# Patient Record
Sex: Male | Born: 1960 | Race: Black or African American | Hispanic: No | Marital: Married | Smoking: Never smoker
Health system: Southern US, Community
[De-identification: ages and names within clinical notes are randomized; demographics above are authoritative.]

## PROBLEM LIST (undated history)

## (undated) DIAGNOSIS — K219 Gastro-esophageal reflux disease without esophagitis: Secondary | ICD-10-CM

## (undated) DIAGNOSIS — N183 Chronic kidney disease, stage 3 unspecified: Secondary | ICD-10-CM

## (undated) DIAGNOSIS — T7840XA Allergy, unspecified, initial encounter: Secondary | ICD-10-CM

## (undated) DIAGNOSIS — G4730 Sleep apnea, unspecified: Secondary | ICD-10-CM

## (undated) DIAGNOSIS — I10 Essential (primary) hypertension: Secondary | ICD-10-CM

## (undated) DIAGNOSIS — E7800 Pure hypercholesterolemia, unspecified: Secondary | ICD-10-CM

## (undated) HISTORY — PX: KNEE CARTILAGE SURGERY: SHX688

## (undated) HISTORY — DX: Allergy, unspecified, initial encounter: T78.40XA

## (undated) HISTORY — DX: Sleep apnea, unspecified: G47.30

---

## 2000-06-07 ENCOUNTER — Emergency Department (HOSPITAL_COMMUNITY): Admission: EM | Admit: 2000-06-07 | Discharge: 2000-06-07 | Payer: Self-pay | Admitting: Emergency Medicine

## 2001-08-30 ENCOUNTER — Ambulatory Visit (HOSPITAL_BASED_OUTPATIENT_CLINIC_OR_DEPARTMENT_OTHER): Admission: RE | Admit: 2001-08-30 | Discharge: 2001-08-30 | Payer: Self-pay | Admitting: Otolaryngology

## 2001-10-28 ENCOUNTER — Emergency Department (HOSPITAL_COMMUNITY): Admission: EM | Admit: 2001-10-28 | Discharge: 2001-10-28 | Payer: Self-pay | Admitting: *Deleted

## 2003-03-18 ENCOUNTER — Emergency Department (HOSPITAL_COMMUNITY): Admission: EM | Admit: 2003-03-18 | Discharge: 2003-03-18 | Payer: Self-pay | Admitting: Emergency Medicine

## 2008-02-21 ENCOUNTER — Emergency Department (HOSPITAL_COMMUNITY): Admission: EM | Admit: 2008-02-21 | Discharge: 2008-02-21 | Payer: Self-pay | Admitting: Emergency Medicine

## 2011-06-04 DIAGNOSIS — I10 Essential (primary) hypertension: Secondary | ICD-10-CM | POA: Insufficient documentation

## 2016-01-07 ENCOUNTER — Ambulatory Visit: Payer: BC Managed Care – PPO | Attending: Family Medicine | Admitting: Physical Therapy

## 2016-10-12 ENCOUNTER — Emergency Department (HOSPITAL_BASED_OUTPATIENT_CLINIC_OR_DEPARTMENT_OTHER): Payer: BC Managed Care – PPO

## 2016-10-12 ENCOUNTER — Emergency Department (HOSPITAL_BASED_OUTPATIENT_CLINIC_OR_DEPARTMENT_OTHER)
Admission: EM | Admit: 2016-10-12 | Discharge: 2016-10-12 | Disposition: A | Discharge status: Home / Self Care | Payer: BC Managed Care – PPO | Attending: Emergency Medicine | Admitting: Emergency Medicine

## 2016-10-12 ENCOUNTER — Encounter (HOSPITAL_BASED_OUTPATIENT_CLINIC_OR_DEPARTMENT_OTHER): Payer: Self-pay | Admitting: Emergency Medicine

## 2016-10-12 DIAGNOSIS — M25562 Pain in left knee: Secondary | ICD-10-CM | POA: Diagnosis not present

## 2016-10-12 DIAGNOSIS — I10 Essential (primary) hypertension: Secondary | ICD-10-CM | POA: Diagnosis not present

## 2016-10-12 HISTORY — DX: Essential (primary) hypertension: I10

## 2016-10-12 MED ORDER — NAPROXEN 375 MG PO TABS: 375.0000 mg | ORAL_TABLET | Freq: Two times a day (BID) | ORAL | 0 refills | Status: DC

## 2016-10-12 NOTE — ED Triage Notes (Signed)
Patient reports inflammation and pain to left knee x 3 weeks.  Denies injury.

## 2016-10-12 NOTE — ED Provider Notes (Signed)
Ryan Duarte   CSN: [id number] Arrival date & time: 10/12/16  1219     History   Chief Complaint Chief Complaint  Patient presents with  . Knee Pain    HPI Ryan Duarte is a 56 y.o. male with history of hypertension who presents with a 1 month history of left knee pain and swelling. Patient states his pain started after playing a basketball game. Patient doesn't remember a specific trauma, however he states he may have overextended his knee or something of the like. Patient does not have any pain at rest or with passive movement. He has pain after walking, squatting, or twisting his knee and certain directions. Patient has taken Tylenol at home without significant relief. He is also used ice. 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 He has not been able to rest his knee very much. He denies any fevers. He denies any numbness, however has had some intermittent tingling to the area.  HPI  Past Medical History:  Diagnosis Date  . Hypertension     There are no active problems to display for this patient.   History reviewed. No pertinent surgical history.     Home Medications    Prior to Admission medications   Medication Sig Start Date End Date Taking? Authorizing Provider  naproxen (NAPROSYN) 375 MG tablet Take 1 tablet (375 mg total) by mouth 2 (two) times daily. 10/12/16   Frederica Kuster, PA-C    Family History History reviewed. No pertinent family history.  Social History Social History  Substance Use Topics  . Smoking status: Never Smoker  . Smokeless tobacco: Never Used  . Alcohol use No     Allergies   Patient has no known allergies.   Review of Systems Review of Systems  Constitutional: Negative for fever.  Musculoskeletal: Positive for arthralgias and joint swelling.  Neurological: Negative for numbness.     Physical Exam Updated Vital Signs BP (!) 165/98 (BP  Location: Right Arm)   Pulse 96   Temp 98.4 F (36.9 C) (Oral)   Resp 16   Ht 5\' 11"  (1.803 m)   Wt 93 kg   SpO2 100%   BMI 28.59 kg/m   Physical Exam  Constitutional: He appears well-developed and well-nourished. No distress.  HENT:  Head: Normocephalic and atraumatic.  Mouth/Throat: Oropharynx is clear and moist. No oropharyngeal exudate.  Eyes: Conjunctivae are normal. Pupils are equal, round, and reactive to light. Right eye exhibits no discharge. Left eye exhibits no discharge. No scleral icterus.  Neck: Normal range of motion. Neck supple. No thyromegaly present.  Cardiovascular: Normal rate, regular rhythm, normal heart sounds and intact distal pulses.  Exam reveals no gallop and no friction rub.   No murmur heard. Pulmonary/Chest: Effort normal and breath sounds normal. No stridor. No respiratory distress. He has no wheezes. He has no rales.  Abdominal: Soft. Bowel sounds are normal. He exhibits no distension. There is no tenderness. There is no rebound and no guarding.  Musculoskeletal: He exhibits no edema.       Left knee: He exhibits swelling, effusion and bony tenderness. He exhibits normal range of motion, no LCL laxity and no MCL laxity. Tenderness found. Medial joint line, lateral joint line and patellar tendon tenderness noted.  L knee: Joint line tenderness, some tenderness to patellar tendon; negative anterior/posterior drawer, negative McMurray's, no pain with passive range of motion, no warmth  or erythema to the joint  Lymphadenopathy:    He has no cervical adenopathy.  Neurological: He is alert. Coordination normal.  Skin: Skin is warm and dry. No rash noted. He is not diaphoretic. No pallor.  Psychiatric: He has a normal mood and affect.  Nursing Duarte and vitals reviewed.    ED Treatments / Results  Labs (all labs ordered are listed, but only abnormal results are displayed) Labs Reviewed - No data to display  EKG  EKG Interpretation None        Radiology Dg Knee Complete 4 Views Left  Result Date: 10/12/2016 CLINICAL DATA:  Three-week history of pain EXAM: LEFT KNEE - COMPLETE 4+ VIEW COMPARISON:  None. FINDINGS: Frontal, lateral, and bilateral oblique views were obtained. No fracture or dislocation. There is a joint effusion, moderate. Joint spaces appear normal. No erosive change. IMPRESSION: Moderate joint effusion. No fracture or dislocation. No appreciable arthropathy. Electronically Signed   By: Lowella Grip III M.D.   On: 10/12/2016 13:34    Procedures Procedures (including critical care time)  Medications Ordered in ED Medications - No data to display   Initial Impression / Assessment and Plan / ED Course  I have reviewed the triage vital signs and the nursing notes.  Pertinent labs & imaging results that were available during my care of the patient were reviewed by me and considered in my medical decision making (see chart for details).     Patient with 1 month of knee pain after basketball game. X-ray shows moderate joint effusion, no fracture or dislocation or appreciable arthropathy. Suspect soft tissue injury. Doubt septic joint, as patient has no pain with passive range of motion or warmth or erythema of the joint. We'll place an knee sleeve and given crutches for comfort. Supportive treatment discussed including ice, elevation. Will begin NSAID treatment with Naprosyn. Follow-up to Dr. Barbaraann Barthel for further evaluation and treatment. Return precautions discussed. Patient understands and agrees with plan. Patient vitals stable to ED course and discharged in satisfactory condition.  Final Clinical Impressions(s) / ED Diagnoses   Final diagnoses:  Acute pain of left knee    New Prescriptions New Prescriptions   NAPROXEN (NAPROSYN) 375 MG TABLET    Take 1 tablet (375 mg total) by mouth 2 (two) times daily.     Frederica Kuster, PA-C 10/12/16 [place], DO 10/12/16 1443

## 2016-10-12 NOTE — Discharge Instructions (Signed)
Medications: Naprosyn  Treatment: Take Naprosyn twice daily for 10 days. You can alternate with Tylenol as prescribed over-the-counter. Do not take ibuprofen or other NSAID medication with Naprosyn. Use ice 3-4 times daily alternating 20 minutes on, 20 minutes off. Keep leg elevated whenever you're not walking on it. Ambulate with crutches and use knee brace for comfort, support, and to help the swelling.  Follow-up: Please follow-up with Dr. Barbaraann Barthel, a sports medicine doctor, for further evaluation and treatment of your knee pain. Please return to emergency department immediately if you develop any fevers, pain with small movements of your knee, redness, or warmth of the joint.

## 2016-10-20 ENCOUNTER — Encounter: Payer: Self-pay | Admitting: Family Medicine

## 2016-10-20 ENCOUNTER — Ambulatory Visit (INDEPENDENT_AMBULATORY_CARE_PROVIDER_SITE_OTHER): Payer: BC Managed Care – PPO | Admitting: Family Medicine

## 2016-10-20 DIAGNOSIS — M25562 Pain in left knee: Secondary | ICD-10-CM

## 2016-10-20 NOTE — Patient Instructions (Addendum)
Your knee pain is either due to arthritis (more than is seen on x-rays) or a medial meniscus tear. Both are treated similarly initially. These are the different medications you can take for this: Tylenol 500mg  1-2 tabs three times a day for pain. Naproxen twice a day with food - when you run out of this you can buy aleve and take 2 tablets twice a day with food. Capsaicin, aspercreme, or biofreeze topically up to four times a day may also help with pain. Cortisone injections are an option. It's important that you continue to stay active. Straight leg raises, knee extensions 3 sets of 10 once a day (add ankle weight if these become too easy). Consider physical therapy to strengthen muscles around the joint that hurts to take pressure off of the joint itself. Shoe inserts with good arch support may be helpful. Heat or ice 15 minutes at a time 3-4 times a day as needed to help with pain. Knee brace when up and walking around for support. Follow up with me in 1 month for reevaluation but call me sooner if you're struggling.

## 2016-10-22 DIAGNOSIS — M25562 Pain in left knee: Secondary | ICD-10-CM | POA: Insufficient documentation

## 2016-10-22 DIAGNOSIS — T7840XA Allergy, unspecified, initial encounter: Secondary | ICD-10-CM | POA: Insufficient documentation

## 2016-10-22 NOTE — Assessment & Plan Note (Signed)
independently reviewed radiographs and these were normal - no evidence of DJD but was not standing.  Based on history, exam, pain is consistent with either flare of arthritis (worse than is seen on radiographs) or degenerative medial meniscus tear.  Both treated similarly.  Start with tylenol, naproxen, topical medications, home exercises which were reviewed.  Knee brace for support.  Declined injection, formal PT for now.  F/u in 1 month.

## 2016-10-22 NOTE — Progress Notes (Signed)
PCP: [facility] @ [facility]  Subjective:   HPI: Patient is a 56 y.o. male here for left knee pain.  Patient reports he was playing basketball about 6 weeks ago. He believes it's possible he twisted his knee or landed wrong but can't recall obvious injury. After this he developed medial pain left knee that was sharp. Up to 6/10 level, worse with walking and prolonged standing. Some swelling. Taking naproxen from ED and relatively resting. No skin changes, numbness.  Past Medical History:  Diagnosis Date  . Hypertension     Current Outpatient Prescriptions on File Prior to Visit  Medication Sig Dispense Refill  . naproxen (NAPROSYN) 375 MG tablet Take 1 tablet (375 mg total) by mouth 2 (two) times daily. 20 tablet 0   No current facility-administered medications on file prior to visit.     No past surgical history on file.  No Known Allergies  Social History   Social History  . Marital status: Married    Spouse name: N/A  . Number of children: N/A  . Years of education: N/A   Occupational History  . Not on file.   Social History Main Topics  . Smoking status: Never Smoker  . Smokeless tobacco: Never Used  . Alcohol use No  . Drug use: No  . Sexual activity: Not on file   Other Topics Concern  . Not on file   Social History Narrative  . No narrative on file    No family history on file.  BP (!) 171/105   Pulse 82   Ht 5\' 10"  (1.778 m)   Wt 205 lb (93 kg)   BMI 29.41 kg/m   Review of Systems: See HPI above.     Objective:  Physical Exam:  Gen: NAD, comfortable in exam room  Left knee: No gross deformity, ecchymoses.  Minimal effusion. TTP medial joint line.  No other tenderness. FROM. Negative ant/post drawers. Negative valgus/varus testing. Negative lachmanns. Mild pain with mcmurrays, apleys, negative patellar apprehension. NV intact distally.  Right knee: FROM without pain.   Assessment & Plan:  1. Left knee pain -  independently reviewed radiographs and these were normal - no evidence of DJD but was not standing.  Based on history, exam, pain is consistent with either flare of arthritis (worse than is seen on radiographs) or degenerative medial meniscus tear.  Both treated similarly.  Start with tylenol, naproxen, topical medications, home exercises which were reviewed.  Knee brace for support.  Declined injection, formal PT for now.  F/u in 1 month.

## 2016-11-20 ENCOUNTER — Ambulatory Visit: Payer: BC Managed Care – PPO | Admitting: Family Medicine

## 2017-04-20 ENCOUNTER — Other Ambulatory Visit (HOSPITAL_COMMUNITY): Payer: Self-pay | Admitting: Family Medicine

## 2017-04-20 DIAGNOSIS — M5431 Sciatica, right side: Secondary | ICD-10-CM

## 2017-04-25 ENCOUNTER — Ambulatory Visit (HOSPITAL_COMMUNITY)
Admission: RE | Admit: 2017-04-25 | Discharge: 2017-04-25 | Disposition: A | Discharge status: Home / Self Care | Payer: BC Managed Care – PPO | Source: Ambulatory Visit | Attending: Family Medicine | Admitting: Family Medicine

## 2017-04-25 DIAGNOSIS — G959 Disease of spinal cord, unspecified: Secondary | ICD-10-CM | POA: Insufficient documentation

## 2017-04-25 DIAGNOSIS — M5126 Other intervertebral disc displacement, lumbar region: Secondary | ICD-10-CM | POA: Insufficient documentation

## 2017-04-25 DIAGNOSIS — M48061 Spinal stenosis, lumbar region without neurogenic claudication: Secondary | ICD-10-CM | POA: Insufficient documentation

## 2017-04-25 DIAGNOSIS — M5431 Sciatica, right side: Secondary | ICD-10-CM | POA: Insufficient documentation

## 2017-07-15 IMAGING — DX DG KNEE COMPLETE 4+V*L*
4 series · 4 of 4 positions shown · non-contrast
Comparison: None.

CLINICAL DATA: Three-week history of pain

EXAM:
LEFT KNEE - COMPLETE 4+ VIEW

[knee ap]
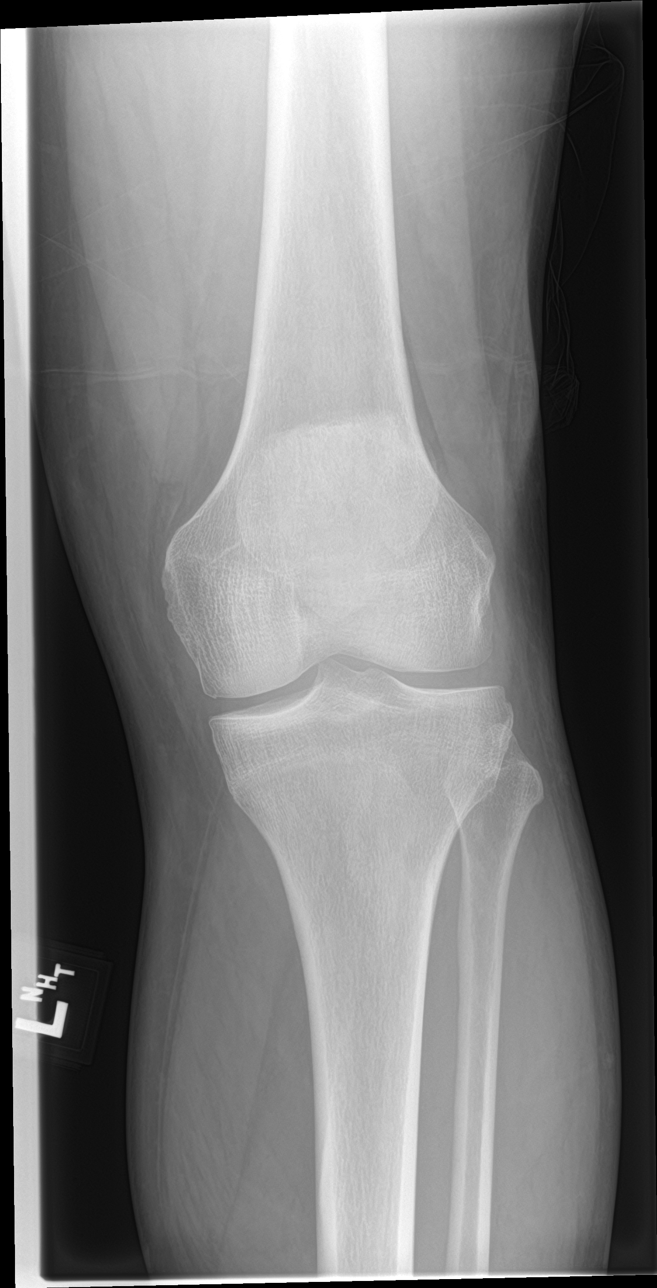

[knee lat]
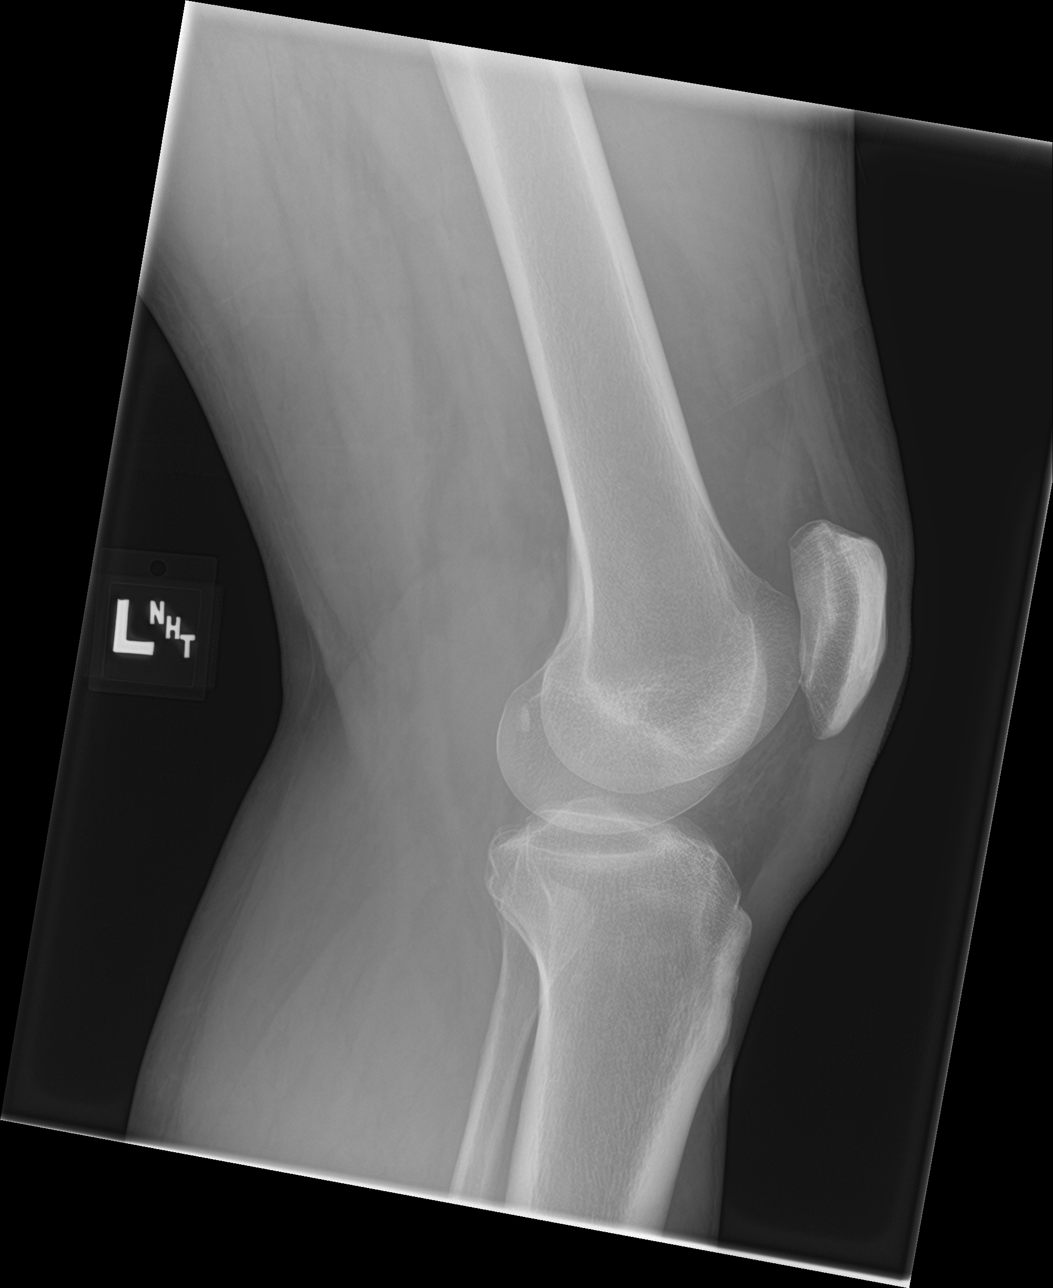

[knee obl (1 of 2)]
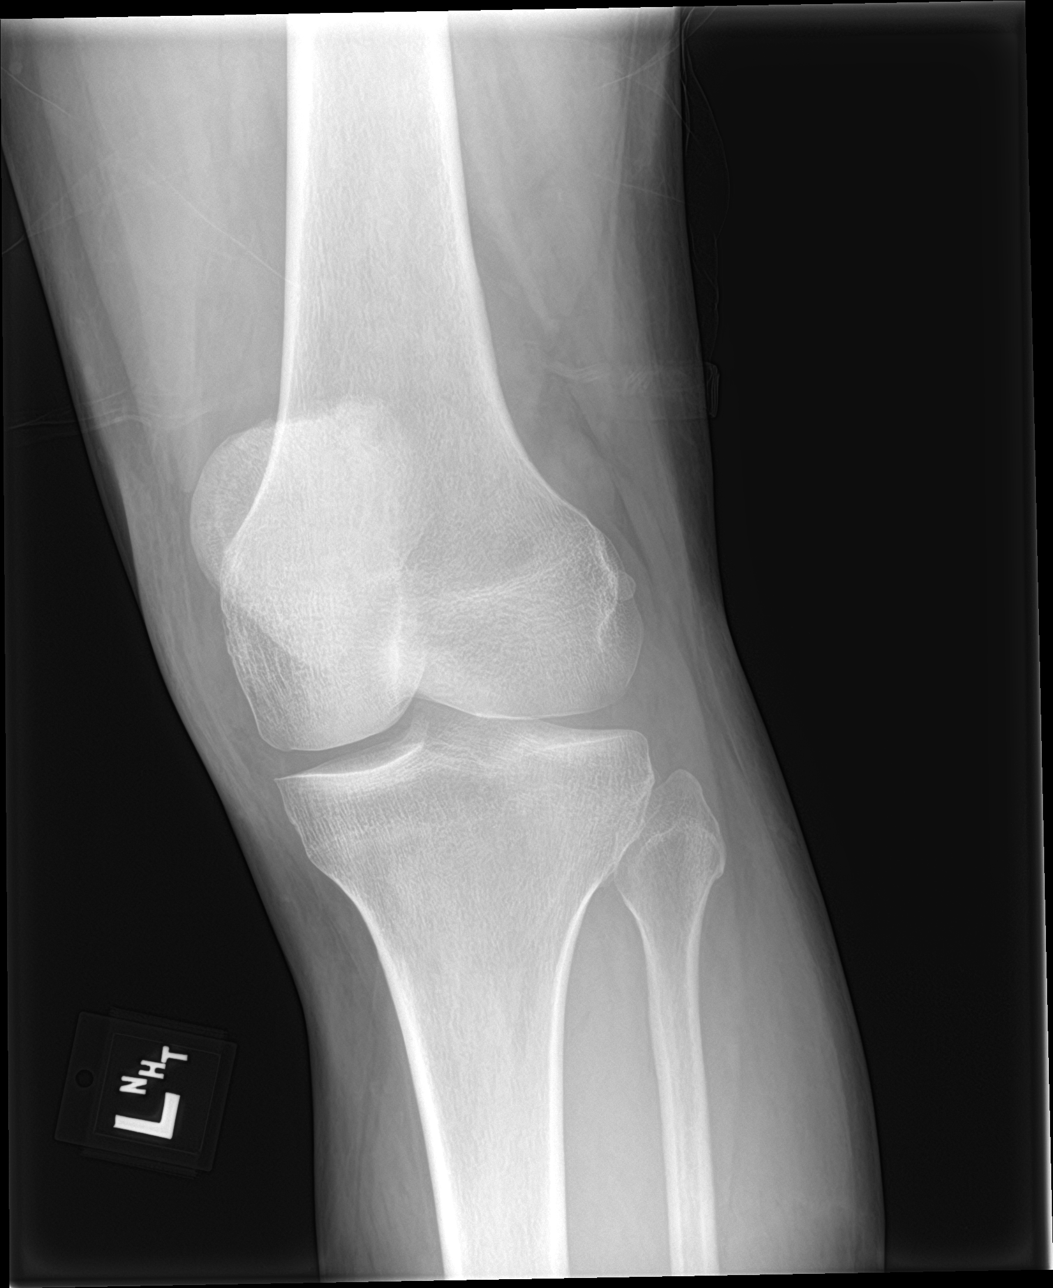

[knee obl (2 of 2)]
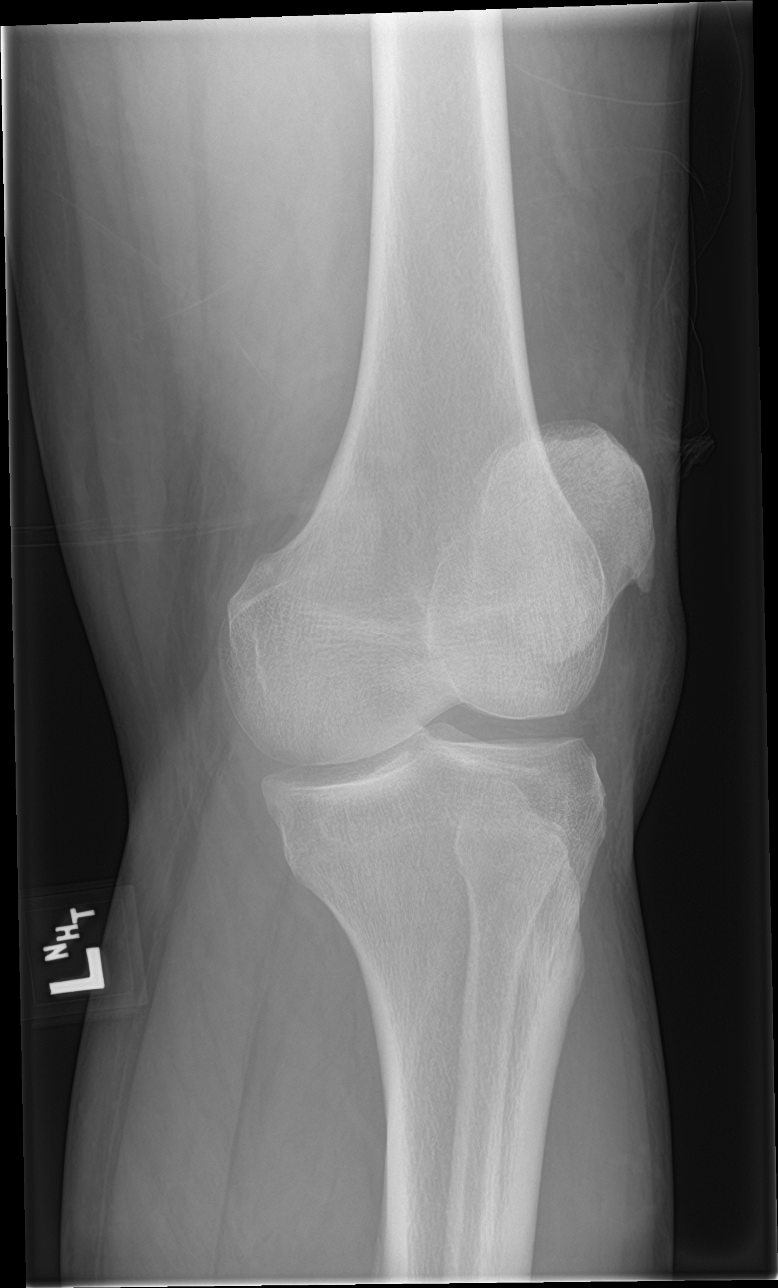

[4 of 4 positions shown; findings below may reference images not displayed]

FINDINGS: Frontal, lateral, and bilateral oblique views were obtained. No
fracture or dislocation. There is a joint effusion, moderate. Joint
spaces appear normal. No erosive change.
IMPRESSION: Moderate joint effusion. No fracture or dislocation. No appreciable
arthropathy.

## 2019-01-14 ENCOUNTER — Other Ambulatory Visit: Payer: Self-pay | Admitting: Otolaryngology

## 2019-01-25 ENCOUNTER — Other Ambulatory Visit (HOSPITAL_COMMUNITY)
Admission: RE | Admit: 2019-01-25 | Discharge: 2019-01-25 | Disposition: A | Discharge status: Home / Self Care | Payer: BC Managed Care – PPO | Source: Ambulatory Visit | Attending: Otolaryngology | Admitting: Otolaryngology

## 2019-01-25 DIAGNOSIS — Z01812 Encounter for preprocedural laboratory examination: Secondary | ICD-10-CM | POA: Diagnosis not present

## 2019-01-25 DIAGNOSIS — Z1159 Encounter for screening for other viral diseases: Secondary | ICD-10-CM | POA: Insufficient documentation

## 2019-01-25 LAB — SARS CORONAVIRUS 2 (TAT 6-24 HRS): SARS Coronavirus 2: NEGATIVE

## 2019-01-25 NOTE — Progress Notes (Signed)
[facility] [phone] - [place], [place] - [address] [place] [address] Phone: [phone] Fax: [phone]      Your procedure is scheduled on July 10th.  Report to [facility] Main Entrance "A" at 7:00 A.M., and check in at the Admitting office.  Call this number if you have problems the morning of surgery:  [phone]  Call [phone] if you have any questions prior to your surgery date Monday-Friday 8am-4pm    Remember:  Do not eat or drink after midnight the night before your surgery     Take these medicines the morning of surgery with A SIP OF WATER   Amlodipine (Norvasc)  Atorvastatin (Lipitor)  Protonix  7 days prior to surgery STOP taking any Aspirin (unless otherwise instructed by your surgeon), Aleve, Naproxen, Ibuprofen, Motrin, Advil, Goody's, BC's, all herbal medications, fish oil, and all vitamins.    The Morning of Surgery  Do not wear jewelry.  Do not wear lotions, powders, or perfumes/colognes, or deodorant  Men may shave face and neck.  Do not bring valuables to the hospital.  [facility] is not responsible for any belongings or valuables.  If you are a smoker, DO NOT Smoke 24 hours prior to surgery IF you wear a CPAP at night please bring your mask, tubing, and machine the morning of surgery   Remember that you must have someone to transport you home after your surgery, and remain with you for 24 hours if you are discharged the same day.   Contacts, glasses, hearing aids, dentures or bridgework may not be worn into surgery.    Leave your suitcase in the car.  After surgery it may be brought to your room.  For patients admitted to the hospital, discharge time will be determined by your treatment team.  Patients discharged the day of surgery will not be allowed to drive home.    Special instructions:   [facility] [place]- Preparing For Surgery  Before surgery, you can  play an important role. Because skin is not sterile, your skin needs to be as free of germs as possible. You can reduce the number of germs on your skin by washing with CHG (chlorahexidine gluconate) Soap before surgery.  CHG is an antiseptic cleaner which kills germs and bonds with the skin to continue killing germs even after washing.    Oral Hygiene is also important to reduce your risk of infection.  Remember - BRUSH YOUR TEETH THE MORNING OF SURGERY WITH YOUR REGULAR TOOTHPASTE  Please do not use if you have an allergy to CHG or antibacterial soaps. If your skin becomes reddened/irritated stop using the CHG.  Do not shave (including legs and underarms) for at least 48 hours prior to first CHG shower. It is OK to shave your face.  Please follow these instructions carefully.   1. Shower the NIGHT BEFORE SURGERY and the MORNING OF SURGERY with CHG Soap.   2. If you chose to wash your hair, wash your hair first as usual with your normal shampoo.  3. After you shampoo, rinse your hair and body thoroughly to remove the shampoo.  4. Use CHG as you would any other liquid soap. You can apply CHG directly to the skin and wash gently with a scrungie or a clean washcloth.   5. Apply the CHG Soap to your body ONLY FROM THE NECK DOWN.  Do not use on open wounds or open sores. Avoid  contact with your eyes, ears, mouth and genitals (private parts). Wash Face and genitals (private parts)  with your normal soap.   6. Wash thoroughly, paying special attention to the area where your surgery will be performed.  7. Thoroughly rinse your body with warm water from the neck down.  8. DO NOT shower/wash with your normal soap after using and rinsing off the CHG Soap.  9. Pat yourself dry with a CLEAN TOWEL.  10. Wear CLEAN PAJAMAS to bed the night before surgery, wear comfortable clothes the morning of surgery  11. Place CLEAN SHEETS on your bed the night of your first shower and DO NOT SLEEP WITH  PETS.    Day of Surgery:  Do not apply any deodorants/lotions. Please shower the morning of surgery with the CHG soap  Please wear clean clothes to the hospital/surgery center.   Remember to brush your teeth WITH YOUR REGULAR TOOTHPASTE.   Please read over the following fact sheets that you were given.

## 2019-01-26 ENCOUNTER — Encounter (HOSPITAL_COMMUNITY)
Admission: RE | Admit: 2019-01-26 | Discharge: 2019-01-26 | Disposition: A | Discharge status: Home / Self Care | Payer: BC Managed Care – PPO | Source: Ambulatory Visit | Attending: Otolaryngology | Admitting: Otolaryngology

## 2019-01-26 ENCOUNTER — Encounter (HOSPITAL_COMMUNITY): Payer: Self-pay

## 2019-01-26 ENCOUNTER — Other Ambulatory Visit: Payer: Self-pay

## 2019-01-26 DIAGNOSIS — J343 Hypertrophy of nasal turbinates: Secondary | ICD-10-CM | POA: Diagnosis not present

## 2019-01-26 DIAGNOSIS — I10 Essential (primary) hypertension: Secondary | ICD-10-CM | POA: Diagnosis not present

## 2019-01-26 DIAGNOSIS — Z01818 Encounter for other preprocedural examination: Secondary | ICD-10-CM | POA: Insufficient documentation

## 2019-01-26 DIAGNOSIS — Q308 Other congenital malformations of nose: Secondary | ICD-10-CM | POA: Diagnosis not present

## 2019-01-26 HISTORY — DX: Pure hypercholesterolemia, unspecified: E78.00

## 2019-01-26 HISTORY — DX: Gastro-esophageal reflux disease without esophagitis: K21.9

## 2019-01-26 LAB — BASIC METABOLIC PANEL
Anion gap: 10 (ref 5–15)
BUN: 17 mg/dL (ref 6–20)
CO2: 21 mmol/L — ABNORMAL LOW (ref 22–32)
Calcium: 9.1 mg/dL (ref 8.9–10.3)
Chloride: 109 mmol/L (ref 98–111)
Creatinine, Ser: 1.61 mg/dL — ABNORMAL HIGH (ref 0.61–1.24)
GFR calc Af Amer: 55 mL/min — ABNORMAL LOW (ref >60)
GFR calc non Af Amer: 47 mL/min — ABNORMAL LOW (ref >60)
Glucose, Bld: 107 mg/dL — ABNORMAL HIGH (ref 70–99)
Potassium: 4.1 mmol/L (ref 3.5–5.1)
Sodium: 140 mmol/L (ref 135–145)

## 2019-01-26 LAB — CBC
HCT: 45.5 % (ref 39.0–52.0)
Hemoglobin: 15 g/dL (ref 13.0–17.0)
MCH: 28.3 pg (ref 26.0–34.0)
MCHC: 33 g/dL (ref 30.0–36.0)
MCV: 85.8 fL (ref 80.0–100.0)
Platelets: 307 10*3/uL (ref 150–400)
RBC: 5.3 MIL/uL (ref 4.22–5.81)
RDW: 14.5 % (ref 11.5–15.5)
WBC: 4.9 10*3/uL (ref 4.0–10.5)
nRBC: 0 % (ref 0.0–0.2)

## 2019-01-26 NOTE — Progress Notes (Signed)
PCP - Dr. Dorthy Cooler Cardiologist - n/a  Chest x-ray - n/a EKG - 01/26/2019 Stress Test - patient states it was about 10 years ago.  Patient is unsure where he had it done.  Patient states it was a normal test and did not require any follow up since the test. ECHO - patient denies Cardiac Cath - patient denies  Sleep Study - patient states it was in 1999 but is not sure where it was done. CPAP - yes, wears it nightly  Fasting Blood Sugar - n/a Checks Blood Sugar _____ times a day  Blood Thinner Instructions: n/a Aspirin Instructions: n/a  Anesthesia review: n/a  Patient denies shortness of breath, fever, cough and chest pain at PAT appointment   Coronavirus Screening  Have you experienced the following symptoms:  Cough yes/no: No Fever (>100.77F)  yes/no: No Runny nose yes/no: No Sore throat yes/no: No Difficulty breathing/shortness of breath  yes/no: No  Have you or a family member traveled in the last 14 days and where? yes/no: No   If the patient indicates "YES" to the above questions, their PAT will be rescheduled to limit the exposure to others and, the surgeon will be notified. THE PATIENT WILL NEED TO BE ASYMPTOMATIC FOR 14 DAYS.   If the patient is not experiencing any of these symptoms, the PAT nurse will instruct them to NOT bring anyone with them to their appointment since they may have these symptoms or traveled as well.   Please remind your patients and families that hospital visitation restrictions are in effect and the importance of the restrictions.    Patient verbalized understanding of instructions that were given to them at the PAT appointment. Patient was also instructed that they will need to review over the PAT instructions again at home before surgery.

## 2019-01-28 ENCOUNTER — Other Ambulatory Visit: Payer: Self-pay

## 2019-01-28 ENCOUNTER — Encounter (HOSPITAL_COMMUNITY): Payer: Self-pay | Admitting: Registered Nurse

## 2019-01-28 ENCOUNTER — Ambulatory Visit (HOSPITAL_COMMUNITY)
Admission: RE | Admit: 2019-01-28 | Discharge: 2019-01-28 | Disposition: A | Discharge status: Home / Self Care | Payer: BC Managed Care – PPO | Attending: Otolaryngology | Admitting: Otolaryngology

## 2019-01-28 ENCOUNTER — Encounter (HOSPITAL_COMMUNITY)
Admission: RE | Disposition: A | Discharge status: Home / Self Care | Payer: Self-pay | Source: Home / Self Care | Attending: Otolaryngology

## 2019-01-28 ENCOUNTER — Ambulatory Visit (HOSPITAL_COMMUNITY): Payer: BC Managed Care – PPO | Admitting: Registered Nurse

## 2019-01-28 DIAGNOSIS — E7800 Pure hypercholesterolemia, unspecified: Secondary | ICD-10-CM | POA: Diagnosis not present

## 2019-01-28 DIAGNOSIS — I10 Essential (primary) hypertension: Secondary | ICD-10-CM | POA: Insufficient documentation

## 2019-01-28 DIAGNOSIS — J3489 Other specified disorders of nose and nasal sinuses: Secondary | ICD-10-CM | POA: Diagnosis not present

## 2019-01-28 DIAGNOSIS — Z79899 Other long term (current) drug therapy: Secondary | ICD-10-CM | POA: Diagnosis not present

## 2019-01-28 DIAGNOSIS — J342 Deviated nasal septum: Secondary | ICD-10-CM

## 2019-01-28 DIAGNOSIS — K219 Gastro-esophageal reflux disease without esophagitis: Secondary | ICD-10-CM | POA: Insufficient documentation

## 2019-01-28 DIAGNOSIS — Z791 Long term (current) use of non-steroidal anti-inflammatories (NSAID): Secondary | ICD-10-CM | POA: Insufficient documentation

## 2019-01-28 DIAGNOSIS — J343 Hypertrophy of nasal turbinates: Secondary | ICD-10-CM | POA: Insufficient documentation

## 2019-01-28 HISTORY — PX: NASAL SEPTOPLASTY W/ TURBINOPLASTY: SHX2070

## 2019-01-28 SURGERY — SEPTOPLASTY, NOSE, WITH NASAL TURBINATE REDUCTION
Anesthesia: General | Laterality: Bilateral

## 2019-01-28 MED ORDER — AMOXICILLIN-POT CLAVULANATE 500-125 MG PO TABS: 1.0000 | ORAL_TABLET | Freq: Two times a day (BID) | ORAL | 0 refills | Status: AC

## 2019-01-28 MED ORDER — CHLORHEXIDINE GLUCONATE CLOTH 2 % EX PADS: 6.0000 | MEDICATED_PAD | Freq: Once | CUTANEOUS | Status: DC

## 2019-01-28 MED ORDER — LIDOCAINE 2% (20 MG/ML) 5 ML SYRINGE
INTRAMUSCULAR | Status: DC | PRN
  Administered 2019-01-28: 100 mg via INTRAVENOUS

## 2019-01-28 MED ORDER — CEFAZOLIN SODIUM-DEXTROSE 2-4 GM/100ML-% IV SOLN
2.0000 g | INTRAVENOUS | Status: AC
  Administered 2019-01-28: 2 g via INTRAVENOUS
  Filled 2019-01-28: qty 100

## 2019-01-28 MED ORDER — LACTATED RINGERS IV SOLN
INTRAVENOUS | Status: DC | PRN
  Administered 2019-01-28 (×2): via INTRAVENOUS

## 2019-01-28 MED ORDER — 0.9 % SODIUM CHLORIDE (POUR BTL) OPTIME
TOPICAL | Status: DC | PRN
  Administered 2019-01-28: 10:00:00 1000 mL

## 2019-01-28 MED ORDER — DEXAMETHASONE SODIUM PHOSPHATE 10 MG/ML IJ SOLN
10.0000 mg | Freq: Once | INTRAMUSCULAR | Status: AC
  Administered 2019-01-28: 10 mg via INTRAVENOUS
  Filled 2019-01-28: qty 1

## 2019-01-28 MED ORDER — OXYMETAZOLINE HCL 0.05 % NA SOLN
NASAL | Status: DC | PRN
  Administered 2019-01-28: 1 via TOPICAL

## 2019-01-28 MED ORDER — LIDOCAINE-EPINEPHRINE 1 %-1:100000 IJ SOLN
INTRAMUSCULAR | Status: DC | PRN
  Administered 2019-01-28: 7 mL

## 2019-01-28 MED ORDER — SUGAMMADEX SODIUM 200 MG/2ML IV SOLN
INTRAVENOUS | Status: DC | PRN
  Administered 2019-01-28: 200 mg via INTRAVENOUS

## 2019-01-28 MED ORDER — LIDOCAINE 2% (20 MG/ML) 5 ML SYRINGE
INTRAMUSCULAR | Status: AC
  Filled 2019-01-28: qty 5

## 2019-01-28 MED ORDER — MUPIROCIN 2 % EX OINT
TOPICAL_OINTMENT | CUTANEOUS | Status: DC | PRN
  Administered 2019-01-28: 1 via NASAL

## 2019-01-28 MED ORDER — SUCCINYLCHOLINE CHLORIDE 200 MG/10ML IV SOSY
PREFILLED_SYRINGE | INTRAVENOUS | Status: AC
  Filled 2019-01-28: qty 10

## 2019-01-28 MED ORDER — ACETAMINOPHEN 500 MG PO TABS
1000.0000 mg | ORAL_TABLET | Freq: Once | ORAL | Status: AC
  Administered 2019-01-28: 1000 mg via ORAL
  Filled 2019-01-28: qty 2

## 2019-01-28 MED ORDER — FENTANYL CITRATE (PF) 250 MCG/5ML IJ SOLN
INTRAMUSCULAR | Status: DC | PRN
  Administered 2019-01-28 (×3): 50 ug via INTRAVENOUS

## 2019-01-28 MED ORDER — ROCURONIUM BROMIDE 10 MG/ML (PF) SYRINGE
PREFILLED_SYRINGE | INTRAVENOUS | Status: AC
  Filled 2019-01-28: qty 10

## 2019-01-28 MED ORDER — PHENYLEPHRINE 40 MCG/ML (10ML) SYRINGE FOR IV PUSH (FOR BLOOD PRESSURE SUPPORT)
PREFILLED_SYRINGE | INTRAVENOUS | Status: DC | PRN
  Administered 2019-01-28 (×2): 80 ug via INTRAVENOUS

## 2019-01-28 MED ORDER — ROCURONIUM BROMIDE 10 MG/ML (PF) SYRINGE
PREFILLED_SYRINGE | INTRAVENOUS | Status: DC | PRN
  Administered 2019-01-28: 50 mg via INTRAVENOUS

## 2019-01-28 MED ORDER — ARTIFICIAL TEARS OPHTHALMIC OINT
TOPICAL_OINTMENT | OPHTHALMIC | Status: DC | PRN
  Administered 2019-01-28: 1 via OPHTHALMIC

## 2019-01-28 MED ORDER — MIDAZOLAM HCL 2 MG/2ML IJ SOLN
INTRAMUSCULAR | Status: AC
  Filled 2019-01-28: qty 2

## 2019-01-28 MED ORDER — MUPIROCIN CALCIUM 2 % EX CREA
TOPICAL_CREAM | CUTANEOUS | Status: AC
  Filled 2019-01-28: qty 15

## 2019-01-28 MED ORDER — OXYMETAZOLINE HCL 0.05 % NA SOLN
NASAL | Status: AC
  Administered 2019-01-28: 2 via NASAL
  Filled 2019-01-28: qty 30

## 2019-01-28 MED ORDER — PROPOFOL 10 MG/ML IV BOLUS
INTRAVENOUS | Status: DC | PRN
  Administered 2019-01-28: 150 mg via INTRAVENOUS
  Administered 2019-01-28: 50 mg via INTRAVENOUS

## 2019-01-28 MED ORDER — OXYMETAZOLINE HCL 0.05 % NA SOLN
2.0000 | Freq: Once | NASAL | Status: AC
  Administered 2019-01-28: 2 via NASAL

## 2019-01-28 MED ORDER — PROPOFOL 10 MG/ML IV BOLUS
INTRAVENOUS | Status: AC
  Filled 2019-01-28: qty 40

## 2019-01-28 MED ORDER — SODIUM CHLORIDE 0.9 % IV SOLN
INTRAVENOUS | Status: DC | PRN
  Administered 2019-01-28: 10:00:00 25 ug/min via INTRAVENOUS

## 2019-01-28 MED ORDER — SALINE SPRAY 0.65 % NA SOLN: 1.0000 | NASAL | Status: DC | PRN

## 2019-01-28 MED ORDER — MUPIROCIN 2 % EX OINT
TOPICAL_OINTMENT | CUTANEOUS | Status: AC
  Filled 2019-01-28: qty 22

## 2019-01-28 MED ORDER — LIDOCAINE-EPINEPHRINE 1 %-1:100000 IJ SOLN
INTRAMUSCULAR | Status: AC
  Filled 2019-01-28: qty 1

## 2019-01-28 MED ORDER — FENTANYL CITRATE (PF) 250 MCG/5ML IJ SOLN
INTRAMUSCULAR | Status: AC
  Filled 2019-01-28: qty 5

## 2019-01-28 MED ORDER — ONDANSETRON HCL 4 MG/2ML IJ SOLN
INTRAMUSCULAR | Status: DC | PRN
  Administered 2019-01-28: 4 mg via INTRAVENOUS

## 2019-01-28 MED ORDER — FENTANYL CITRATE (PF) 100 MCG/2ML IJ SOLN: 25.0000 ug | INTRAMUSCULAR | Status: DC | PRN

## 2019-01-28 MED ORDER — OXYMETAZOLINE HCL 0.05 % NA SOLN
NASAL | Status: AC
  Filled 2019-01-28: qty 60

## 2019-01-28 MED ORDER — HYDROCODONE-ACETAMINOPHEN 5-325 MG PO TABS: 1.0000 | ORAL_TABLET | Freq: Two times a day (BID) | ORAL | 0 refills | Status: AC | PRN

## 2019-01-28 MED ORDER — MIDAZOLAM HCL 5 MG/5ML IJ SOLN
INTRAMUSCULAR | Status: DC | PRN
  Administered 2019-01-28: 2 mg via INTRAVENOUS

## 2019-01-28 MED ORDER — PHENYLEPHRINE 40 MCG/ML (10ML) SYRINGE FOR IV PUSH (FOR BLOOD PRESSURE SUPPORT)
PREFILLED_SYRINGE | INTRAVENOUS | Status: AC
  Filled 2019-01-28: qty 10

## 2019-01-28 SURGICAL SUPPLY — 26 items
BLADE SURG 15 STRL LF DISP TIS (BLADE) ×1 IMPLANT
BLADE SURG 15 STRL SS (BLADE) ×2
CANISTER SUCT 3000ML PPV (MISCELLANEOUS) ×3 IMPLANT
COAGULATOR SUCT 8FR VV (MISCELLANEOUS) IMPLANT
COVER WAND RF STERILE (DRAPES) IMPLANT
DRAPE HALF SHEET 40X57 (DRAPES) IMPLANT
ELECT REM PT RETURN 9FT ADLT (ELECTROSURGICAL)
ELECTRODE REM PT RTRN 9FT ADLT (ELECTROSURGICAL) IMPLANT
GAUZE SPONGE 2X2 8PLY STRL LF (GAUZE/BANDAGES/DRESSINGS) ×1 IMPLANT
GLOVE BIOGEL M 7.0 STRL (GLOVE) ×6 IMPLANT
GOWN STRL REUS W/ TWL LRG LVL3 (GOWN DISPOSABLE) ×2 IMPLANT
GOWN STRL REUS W/TWL LRG LVL3 (GOWN DISPOSABLE) ×4
KIT BASIN OR (CUSTOM PROCEDURE TRAY) ×3 IMPLANT
KIT TURNOVER KIT B (KITS) ×3 IMPLANT
NEEDLE HYPO 25GX1X1/2 BEV (NEEDLE) ×3 IMPLANT
NS IRRIG 1000ML POUR BTL (IV SOLUTION) ×3 IMPLANT
PAD ARMBOARD 7.5X6 YLW CONV (MISCELLANEOUS) ×3 IMPLANT
SPLINT NASAL DOYLE BI-VL (GAUZE/BANDAGES/DRESSINGS) ×3 IMPLANT
SPONGE GAUZE 2X2 STER 10/PKG (GAUZE/BANDAGES/DRESSINGS) ×2
SPONGE NEURO XRAY DETECT 1X3 (DISPOSABLE) ×3 IMPLANT
SUT ETHILON 3 0 PS 1 (SUTURE) ×3 IMPLANT
SUT PLAIN 4 0 ~~LOC~~ 1 (SUTURE) ×3 IMPLANT
TOWEL GREEN STERILE FF (TOWEL DISPOSABLE) ×3 IMPLANT
TRAY ENT MC OR (CUSTOM PROCEDURE TRAY) ×3 IMPLANT
TUBE SALEM SUMP 16 FR W/ARV (TUBING) ×3 IMPLANT
TUBING EXTENTION W/L.L. (IV SETS) ×3 IMPLANT

## 2019-01-28 NOTE — H&P (Signed)
Ryan Duarte is an 58 y.o. male.   Chief Complaint: Nasal Obstruction HPI: Prg Deviated septum  Past Medical History:  Diagnosis Date  . GERD (gastroesophageal reflux disease)   . High cholesterol   . Hypertension     Past Surgical History:  Procedure Laterality Date  . KNEE CARTILAGE SURGERY Right     History reviewed. No pertinent family history. Social History:  reports that he has never smoked. He has never used smokeless tobacco. He reports that he does not drink alcohol or use drugs.  Allergies: No Known Allergies  Medications Prior to Admission  Medication Sig Dispense Refill  . amLODipine (NORVASC) 5 MG tablet Take 5 mg by mouth daily.    Marland Kitchen atorvastatin (LIPITOR) 10 MG tablet Take 10 mg by mouth daily.    . benazepril (LOTENSIN) 10 MG tablet Take 10 mg by mouth 2 (two) times daily.    Marland Kitchen ketoconazole (NIZORAL) 2 % cream Apply 1 application topically daily as needed for irritation.    . meloxicam (MOBIC) 15 MG tablet Take 15 mg by mouth daily.    . pantoprazole (PROTONIX) 40 MG tablet Take 40 mg by mouth daily.      Results for orders placed or performed during the hospital encounter of 01/26/19 (from the past 48 hour(s))  CBC     Status: None   Collection Time: 01/26/19  9:35 AM  Result Value Ref Range   WBC 4.9 4.0 - 10.5 K/uL   RBC 5.30 4.22 - 5.81 MIL/uL   Hemoglobin 15.0 13.0 - 17.0 g/dL   HCT 45.5 39.0 - 52.0 %   MCV 85.8 80.0 - 100.0 fL   MCH 28.3 26.0 - 34.0 pg   MCHC 33.0 30.0 - 36.0 g/dL   RDW 14.5 11.5 - 15.5 %   Platelets 307 150 - 400 K/uL   nRBC 0.0 0.0 - 0.2 %    Comment: Performed at [facility], [place] [place] [address] [place], [place] [address]  Basic metabolic panel     Status: Abnormal   Collection Time: 01/26/19  9:35 AM  Result Value Ref Range   Sodium 140 135 - 145 mmol/L   Potassium 4.1 3.5 - 5.1 mmol/L   Chloride 109 98 - 111 mmol/L   CO2 21 (L) 22 - 32 mmol/L   Glucose, Bld 107 (H) 70 - 99 mg/dL   BUN 17 6 - 20 mg/dL   Creatinine, Ser 1.61 (H) 0.61 - 1.24 mg/dL   Calcium 9.1 8.9 - 10.3 mg/dL   GFR calc non Af Amer 47 (L) >60 mL/min   GFR calc Af Amer 55 (L) >60 mL/min   Anion gap 10 5 - 15    Comment: Performed at [facility] [address], [place], [place] [address]   No results found.  Review of Systems  Constitutional: Negative.   HENT: Positive for congestion.   Respiratory: Negative.   Cardiovascular: Negative.     Blood pressure (!) 174/99, pulse 63, temperature 98.2 F (36.8 C), resp. rate 20, height 5' 10.5" (1.791 m), weight 91.6 kg, SpO2 100 %. Physical Exam  Constitutional: He appears well-developed and well-nourished.  HENT:  Deviated septum  Neck: Normal range of motion. Neck supple.     Assessment/Plan Adm for OP septoplasty and IT reduction under GA as OP  Jerrell Belfast, MD 01/28/2019, 9:14 AM

## 2019-01-28 NOTE — Anesthesia Preprocedure Evaluation (Addendum)
Anesthesia Evaluation  Patient identified by MRN, date of birth, ID band Patient awake    Reviewed: Allergy & Precautions, NPO status , Patient's Chart, lab work & pertinent test results  Airway Mallampati: III  TM Distance: >3 FB Neck ROM: Full  Mouth opening: Limited Mouth Opening  Dental no notable dental hx. (+) Teeth Intact, Dental Advisory Given   Pulmonary neg pulmonary ROS,    Pulmonary exam normal breath sounds clear to auscultation       Cardiovascular hypertension, Pt. on medications negative cardio ROS Normal cardiovascular exam Rhythm:Regular Rate:Normal     Neuro/Psych negative neurological ROS  negative psych ROS   GI/Hepatic Neg liver ROS, GERD  Medicated and Controlled,  Endo/Other  negative endocrine ROS  Renal/GU negative Renal ROS  negative genitourinary   Musculoskeletal negative musculoskeletal ROS (+)   Abdominal   Peds  Hematology negative hematology ROS (+)   Anesthesia Other Findings   Reproductive/Obstetrics                            Anesthesia Physical Anesthesia Plan  ASA: II  Anesthesia Plan: General   Post-op Pain Management:    Induction: Intravenous  PONV Risk Score and Plan: 2 and Ondansetron, Dexamethasone and Midazolam  Airway Management Planned: Oral ETT and Video Laryngoscope Planned  Additional Equipment:   Intra-op Plan:   Post-operative Plan: Extubation in OR  Informed Consent: I have reviewed the patients History and Physical, chart, labs and discussed the procedure including the risks, benefits and alternatives for the proposed anesthesia with the patient or authorized representative who has indicated his/her understanding and acceptance.     Dental advisory given  Plan Discussed with: CRNA  Anesthesia Plan Comments:        Anesthesia Quick Evaluation

## 2019-01-28 NOTE — Op Note (Signed)
Operative Note: SEPTOPLASTY AND INFERIOR TURBINATE REDUCTION  Patient: Ryan Duarte record number: [id number]  Date:01/28/2019  Pre-operative Indications: 1. Deviated nasal septum with nasal airway obstruction     2.  Bilateral inferior turbinate hypertrophy  Postoperative Indications: Same  Surgical Procedure: 1.  Nasal Septoplasty    2.  Bilateral Inferior Turbinate Reduction  Anesthesia: GET  Surgeon: Delsa Bern, M.D.  Complications: None  EBL: 50 cc  Findings: Severely deviated nasal septum with airway obstruction and bilateral inferior turbinate hypertrophy.   Brief History: The patient is a 58 y.o. male with a history of progressive nasal airway obstruction. The patient has been on medical therapy to reduce nasal mucosal edema including saline nasal spray and topical nasal steroids. Despite appropriate medical therapy the patient continues to have ongoing symptoms. Given the patient's history and findings, the above surgical procedures were recommended, risks and benefits were discussed in detail with the patient may understand and agree with our plan for surgery which is scheduled at [place] under general anesthesia as an outpatient.  Surgical Procedure: The patient is brought to the operating room on 01/28/2019 and placed in supine position on the operating table. General endotracheal anesthesia was established without difficulty. When the patient was adequately anesthetized, surgical timeout was performed and correct identification of the patient and the surgical procedure. The patient's nose was then injected with  5 cc of 1% lidocaine 1 100,000 dilution epinephrine which was injected in a submucosal fashion. The patient's nose was then packed with Afrin-soaked cottonoid pledgets were left in place for approximately 10 minutes lateral vasoconstriction and hemostasis. .  With the patient prepped draped and prepared for surgery, nasal septoplasty was  begun.  A left anterior hemitransfixion incision was created and a mucoperichondrial flap was elevated from anterior to posterior on the left-hand side. The anterior cartilaginous septum was crossed at the midline and a mucoperichondrial flap was elevated on the patient's right.  Swivel knife was then used to resect the anterior and mid cartilaginous portion of the nasal septum.  Resected cartilage was morcellized and returned to the mucoperichondrial pocket at the occlusion of the surgical procedure.  Dissection was then carried out from anterior to posterior removing deviated bone and cartilage including a large septal spur the overlying mucosa was preserved.  With the septum brought to good midline position, the morselized cartilage was returned to the mucoperichondrial pocket and the soft tissue/mucosal flaps were reapproximated with interrupted 4-0 gut suture on a Keith needle in a horizontal mattressing fashion.  Anterior hemitransfixion incision was closed with the same stitch.  Bilateral Doyle nasal septal splints were then placed after the application of Bactroban ointment and sutured in position with a 3-0 Ethilon suture.  Attention was then turned to the inferior turbinates, bilateral inferior turbinate intramural cautery was performed with cautery setting at 49 W.  2 submucosal passes were made in each inferior turbinate.  After completing cautery, anterior vertical incisions were created and overlying soft tissue was elevated, a small amount of turbinate bone was resected.  The turbinates were then outfractured to create a more patent nasal passageway.  Surgical sponge count was correct. An oral gastric tube was passed and the stomach contents were aspirated. Patient was awakened from anesthetic and transferred from the operating room to the recovery room in stable condition. There were no complications and blood loss was 50cc.   Delsa Bern, M.D. [facility] ENT 01/28/2019

## 2019-01-28 NOTE — Discharge Instructions (Signed)

## 2019-01-28 NOTE — Transfer of Care (Signed)
Immediate Anesthesia Transfer of Care Note  Patient: Ryan Duarte  Procedure(s) Performed: NASAL SEPTOPLASTY WITH BILATERAL INFERIOR TURBINATE REDUCTION (Bilateral )  Patient Location: PACU  Anesthesia Type:General  Level of Consciousness: awake, alert  and oriented  Airway & Oxygen Therapy: Patient Spontanous Breathing and Patient connected to face mask oxygen  Post-op Assessment: Report given to RN and Post -op Vital signs reviewed and stable  Post vital signs: Reviewed and stable  Last Vitals:  Vitals Value Taken Time  BP 160/98   Temp    Pulse 65   Resp 14   SpO2 100     Last Pain:  Vitals:   01/28/19 0740  PainSc: 0-No pain      Patients Stated Pain Goal: 0 (08/65/78 4696)  Complications: No apparent anesthesia complications

## 2019-01-28 NOTE — Anesthesia Postprocedure Evaluation (Signed)
Anesthesia Post Note  Patient: Ryan Duarte  Procedure(s) Performed: NASAL SEPTOPLASTY WITH BILATERAL INFERIOR TURBINATE REDUCTION (Bilateral )     Patient location during evaluation: PACU Anesthesia Type: General Level of consciousness: awake and alert Pain management: pain level controlled Vital Signs Assessment: post-procedure vital signs reviewed and stable Respiratory status: spontaneous breathing, nonlabored ventilation, respiratory function stable and patient connected to nasal cannula oxygen Cardiovascular status: blood pressure returned to baseline and stable Postop Assessment: no apparent nausea or vomiting Anesthetic complications: no    Last Vitals:  Vitals:   01/28/19 1044 01/28/19 1057  BP: (!) 156/95   Pulse: 79   Resp: 12   Temp:  (!) 36.1 C  SpO2: 95%     Last Pain:  Vitals:   01/28/19 1057  PainSc: 0-No pain                 Chelsey L Woodrum

## 2019-01-28 NOTE — Anesthesia Procedure Notes (Signed)
Procedure Name: Intubation Date/Time: 01/28/2019 9:31 AM Performed by: Jearld Pies, CRNA Pre-anesthesia Checklist: Patient identified, Emergency Drugs available, Suction available and Patient being monitored Patient Re-evaluated:Patient Re-evaluated prior to induction Oxygen Delivery Method: Circle System Utilized Preoxygenation: Pre-oxygenation with 100% oxygen Induction Type: IV induction Ventilation: Mask ventilation without difficulty and Oral airway inserted - appropriate to patient size Laryngoscope Size: Glidescope and 4 Grade View: Grade III Tube type: Oral Tube size: 7.0 mm Number of attempts: 2 Airway Equipment and Method: Stylet and Oral airway Placement Confirmation: ETT inserted through vocal cords under direct vision,  positive ETCO2 and breath sounds checked- equal and bilateral Secured at: 23 cm Tube secured with: Tape Dental Injury: Teeth and Oropharynx as per pre-operative assessment  Difficulty Due To: Difficulty was anticipated, Difficult Airway- due to large tongue, Difficult Airway- due to limited oral opening, Difficult Airway- due to anterior larynx and Difficult Airway- due to immobile epiglottis Comments: DL x 1 with Miller 2, unable to pin epiglottis, Glidescope 4 utilized with G1v.

## 2019-01-29 ENCOUNTER — Encounter (HOSPITAL_COMMUNITY): Payer: Self-pay | Admitting: Otolaryngology

## 2019-03-11 ENCOUNTER — Ambulatory Visit
Admission: EM | Admit: 2019-03-11 | Discharge: 2019-03-11 | Disposition: A | Discharge status: Home / Self Care | Payer: BC Managed Care – PPO | Attending: Physician Assistant | Admitting: Physician Assistant

## 2019-03-11 ENCOUNTER — Encounter: Payer: Self-pay | Admitting: Emergency Medicine

## 2019-03-11 ENCOUNTER — Ambulatory Visit (INDEPENDENT_AMBULATORY_CARE_PROVIDER_SITE_OTHER): Payer: BC Managed Care – PPO

## 2019-03-11 ENCOUNTER — Other Ambulatory Visit: Payer: Self-pay

## 2019-03-11 DIAGNOSIS — M79641 Pain in right hand: Secondary | ICD-10-CM | POA: Diagnosis not present

## 2019-03-11 DIAGNOSIS — I10 Essential (primary) hypertension: Secondary | ICD-10-CM | POA: Diagnosis not present

## 2019-03-11 MED ORDER — CEPHALEXIN 500 MG PO CAPS: 500.0000 mg | ORAL_CAPSULE | Freq: Four times a day (QID) | ORAL | 0 refills | Status: DC

## 2019-03-11 MED ORDER — MELOXICAM 15 MG PO TABS: 7.5000 mg | ORAL_TABLET | Freq: Every day | ORAL | 0 refills | Status: DC

## 2019-03-11 NOTE — Discharge Instructions (Signed)
X-ray negative for fracture or dislocation.  Start Keflex for possible infection.  Start Mobic for inflammation.  Continue elevations, compresses to help with swelling.  He can use wrist splint during activity to help with symptoms.  Follow-up with hand orthopedics if symptoms not improving, worsens.
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
## 2019-03-11 NOTE — ED Notes (Signed)
Patient able to ambulate independently  
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
## 2019-04-21 ENCOUNTER — Ambulatory Visit
Admission: EM | Admit: 2019-04-21 | Discharge: 2019-04-21 | Disposition: A | Discharge status: Home / Self Care | Payer: BC Managed Care – PPO

## 2019-04-21 ENCOUNTER — Other Ambulatory Visit: Payer: Self-pay

## 2019-06-20 ENCOUNTER — Other Ambulatory Visit: Payer: Self-pay | Admitting: Cardiology

## 2019-06-20 DIAGNOSIS — Z20822 Contact with and (suspected) exposure to covid-19: Secondary | ICD-10-CM

## 2019-06-22 LAB — NOVEL CORONAVIRUS, NAA: SARS-CoV-2, NAA: NOT DETECTED

## 2019-08-18 ENCOUNTER — Ambulatory Visit: Payer: BC Managed Care – PPO | Attending: Internal Medicine

## 2019-08-18 DIAGNOSIS — Z20822 Contact with and (suspected) exposure to covid-19: Secondary | ICD-10-CM

## 2019-08-19 LAB — NOVEL CORONAVIRUS, NAA: SARS-CoV-2, NAA: NOT DETECTED

## 2019-09-07 ENCOUNTER — Ambulatory Visit: Payer: BC Managed Care – PPO

## 2019-09-17 ENCOUNTER — Ambulatory Visit: Payer: BC Managed Care – PPO | Attending: Internal Medicine

## 2019-09-17 DIAGNOSIS — Z23 Encounter for immunization: Secondary | ICD-10-CM | POA: Insufficient documentation

## 2019-09-17 NOTE — Progress Notes (Signed)
   Covid-19 Vaccination Clinic  Name:  Ryan Duarte    MRN: [id number] DOB: 1961-04-16  09/17/2019  Ryan Duarte was observed post Covid-19 immunization for 15 minutes without incidence. He was provided with Vaccine Information Sheet and instruction to access the V-Safe system.   Ryan Duarte was instructed to call 911 with any severe reactions post vaccine: Mar[place] Kitchen Difficulty breathing  . Swelling of your face and throat  . A fast heartbeat  . A bad rash all over your body  . Dizziness and weakness    Immunizations Administered    Name Date Dose VIS Date Route   Pfizer COVID-19 Vaccine 09/17/2019  3:23 PM 0.3 mL 07/01/2019 Intramuscular   Manufacturer: [place]   Lot: [id number]   Ryan Duarte: [id number]

## 2019-10-08 ENCOUNTER — Ambulatory Visit: Payer: BC Managed Care – PPO | Attending: Internal Medicine

## 2019-10-08 DIAGNOSIS — Z23 Encounter for immunization: Secondary | ICD-10-CM

## 2019-10-08 NOTE — Progress Notes (Signed)
   Covid-19 Vaccination Clinic  Name:  Ryan Duarte    MRN: [id number] DOB: 1960/10/29  10/08/2019  Mr. Ryan Duarte was observed post Covid-19 immunization for 15 minutes without incident. He was provided with Vaccine Information Sheet and instruction to access the V-Safe system.   Mr. Ryan Duarte was instructed to call 911 with any severe reactions post vaccine: Marland Kitchen Difficulty breathing  . Swelling of face and throat  . A fast heartbeat  . A bad rash all over body  . Dizziness and weakness   Immunizations Administered    Name Date Dose VIS Date Route   Pfizer COVID-19 Vaccine 10/08/2019  8:34 AM 0.3 mL 07/01/2019 Intramuscular   Manufacturer: [place]   Lot: [id number]   [place]: [id number]

## 2019-10-27 ENCOUNTER — Other Ambulatory Visit: Payer: Self-pay | Admitting: Neurosurgery

## 2019-10-27 DIAGNOSIS — M5127 Other intervertebral disc displacement, lumbosacral region: Secondary | ICD-10-CM
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
## 2019-12-24 ENCOUNTER — Ambulatory Visit
Admission: RE | Admit: 2019-12-24 | Discharge: 2019-12-24 | Disposition: A | Discharge status: Home / Self Care | Payer: BC Managed Care – PPO | Source: Ambulatory Visit | Attending: Neurosurgery | Admitting: Neurosurgery

## 2019-12-24 DIAGNOSIS — M5127 Other intervertebral disc displacement, lumbosacral region: Secondary | ICD-10-CM

## 2020-03-06 ENCOUNTER — Other Ambulatory Visit: Payer: Self-pay

## 2020-03-06 ENCOUNTER — Other Ambulatory Visit: Payer: BC Managed Care – PPO

## 2020-03-06 DIAGNOSIS — Z20822 Contact with and (suspected) exposure to covid-19: Secondary | ICD-10-CM

## 2020-03-07 LAB — NOVEL CORONAVIRUS, NAA: SARS-CoV-2, NAA: NOT DETECTED

## 2020-03-07 LAB — SARS-COV-2, NAA 2 DAY TAT

## 2020-05-14 ENCOUNTER — Ambulatory Visit: Payer: BC Managed Care – PPO

## 2020-05-21 ENCOUNTER — Ambulatory Visit: Payer: BC Managed Care – PPO | Attending: Internal Medicine

## 2020-05-21 DIAGNOSIS — Z23 Encounter for immunization: Secondary | ICD-10-CM

## 2020-05-21 NOTE — Progress Notes (Signed)
   Covid-19 Vaccination Clinic  Name:  TREVELLE MCGURN    MRN: [id number] DOB: 26-May-1961  05/21/2020  Mr. Troop was observed post Covid-19 immunization for 15 minutes without incident. He was provided with Vaccine Information Sheet and instruction to access the V-Safe system.   Mr. Arnold was instructed to call 911 with any severe reactions post vaccine: Marland Kitchen Difficulty breathing  . Swelling of face and throat  . A fast heartbeat  . A bad rash all over body  . Dizziness and weakness

## 2020-07-12 ENCOUNTER — Other Ambulatory Visit: Payer: BC Managed Care – PPO

## 2020-12-30 ENCOUNTER — Other Ambulatory Visit: Payer: Self-pay

## 2020-12-30 ENCOUNTER — Encounter: Payer: Self-pay | Admitting: Emergency Medicine

## 2020-12-30 ENCOUNTER — Ambulatory Visit
Admission: EM | Admit: 2020-12-30 | Discharge: 2020-12-30 | Disposition: A | Discharge status: Home / Self Care | Payer: BC Managed Care – PPO | Attending: Emergency Medicine | Admitting: Emergency Medicine

## 2020-12-30 DIAGNOSIS — H1013 Acute atopic conjunctivitis, bilateral: Secondary | ICD-10-CM | POA: Diagnosis not present

## 2020-12-30 DIAGNOSIS — J019 Acute sinusitis, unspecified: Secondary | ICD-10-CM

## 2020-12-30 MED ORDER — AMOXICILLIN-POT CLAVULANATE 875-125 MG PO TABS: 1.0000 | ORAL_TABLET | Freq: Two times a day (BID) | ORAL | 0 refills | Status: AC

## 2020-12-30 MED ORDER — BENZONATATE 200 MG PO CAPS: 200.0000 mg | ORAL_CAPSULE | Freq: Three times a day (TID) | ORAL | 0 refills | Status: DC

## 2020-12-30 MED ORDER — OLOPATADINE HCL 0.1 % OP SOLN: 1.0000 [drp] | Freq: Two times a day (BID) | OPHTHALMIC | 12 refills | Status: DC

## 2020-12-30 NOTE — Discharge Instructions (Addendum)
Augmentin twice daily for 1 week Daily Flonase, may add in daily cetirizine/Zyrtec or loratadine/Claritin Tessalon every 8 hours for cough Olopatadine eyedrops twice daily  Follow-up if not improving or worsening

## 2020-12-30 NOTE — ED Provider Notes (Signed)
[facility]    CSN: [id number] Arrival date & time: 12/30/20  1239      History   Chief Complaint Chief Complaint  Patient presents with   Nasal Congestion    HPI Ryan Duarte is a 60 y.o. male history of hypertension, GERD, presenting today for evaluation of URI symptoms.  Reports associated nasal congestion, sore throat and red watery eyes.  Reports symptoms x1.5 weeks.  Congestion postnasal drainage and throat or sore throat.  Also with cough.  Eyes are red and watery, crusting in the morning, but denies pustular drainage during the day.  Denies contact use.  Denies vision changes.  HPI  Past Medical History:  Diagnosis Date   GERD (gastroesophageal reflux disease)    High cholesterol    Hypertension     Patient Active Problem List   Diagnosis Date Noted   Deviated septum 01/28/2019   Allergy 10/22/2016   Left knee pain 10/22/2016   HTN (hypertension) 06/04/2011    Past Surgical History:  Procedure Laterality Date   KNEE CARTILAGE SURGERY Right    NASAL SEPTOPLASTY W/ TURBINOPLASTY Bilateral 01/28/2019   Procedure: NASAL SEPTOPLASTY WITH BILATERAL INFERIOR TURBINATE REDUCTION;  Surgeon: Jerrell Belfast, MD;  Location: [place];  Service: ENT;  Laterality: Bilateral;       Home Medications    Prior to Admission medications   Medication Sig Start Date End Date Taking? Authorizing Provider  amoxicillin-clavulanate (AUGMENTIN) 875-125 MG tablet Take 1 tablet by mouth every 12 (twelve) hours for 7 days. 12/30/20 01/06/21 Yes Caedan Sumler C, PA-C  benzonatate (TESSALON) 200 MG capsule Take 1 capsule (200 mg total) by mouth every 8 (eight) hours. 12/30/20  Yes Carlynn Leduc C, PA-C  olopatadine (PATANOL) 0.1 % ophthalmic solution Place 1 drop into both eyes 2 (two) times daily. 12/30/20  Yes Aizza Santiago C, PA-C  amLODipine (NORVASC) 5 MG tablet Take 5 mg by mouth daily.    [provider]  atorvastatin (LIPITOR) 10 MG tablet Take 10 mg by  mouth daily.    [provider]  benazepril (LOTENSIN) 10 MG tablet Take 10 mg by mouth 2 (two) times daily.    [provider]  ketoconazole (NIZORAL) 2 % cream Apply 1 application topically daily as needed for irritation.    [provider]  pantoprazole (PROTONIX) 40 MG tablet Take 40 mg by mouth daily. 01/10/19   [provider]    Family History History reviewed. No pertinent family history.  Social History Social History   Tobacco Use   Smoking status: Never   Smokeless tobacco: Never  Vaping Use   Vaping Use: Never used  Substance Use Topics   Alcohol use: No   Drug use: No     Allergies   Patient has no known allergies.   Review of Systems Review of Systems  Constitutional:  Negative for activity change, appetite change, chills, fatigue and fever.  HENT:  Positive for congestion and rhinorrhea. Negative for ear pain, sinus pressure, sore throat and trouble swallowing.   Eyes:  Positive for discharge and redness.  Respiratory:  Positive for cough. Negative for chest tightness and shortness of breath.   Cardiovascular:  Negative for chest pain.  Gastrointestinal:  Negative for abdominal pain, diarrhea, nausea and vomiting.  Musculoskeletal:  Negative for myalgias.  Skin:  Negative for rash.  Neurological:  Negative for dizziness, light-headedness and headaches.    Physical Exam Triage Vital Signs ED Triage Vitals  Enc Vitals Group  BP      Pulse      Resp      Temp      Temp src      SpO2      Weight      Height      Head Circumference      Peak Flow      Pain Score      Pain Loc      Pain Edu?      Excl. in [place]?    No data found.  Updated Vital Signs BP (!) 175/96 (BP Location: Left Arm)   Pulse (!) 56   Temp 97.9 F (36.6 C) (Oral)   Resp 20   SpO2 97%   Visual Acuity Right Eye Distance:   Left Eye Distance:   Bilateral Distance:    Right Eye Near:   Left Eye Near:    Bilateral Near:      Physical Exam Vitals and nursing note reviewed.  Constitutional:      Appearance: He is well-developed.     Comments: No acute distress  HENT:     Head: Normocephalic and atraumatic.     Ears:     Comments: Bilateral ears without tenderness to palpation of external auricle, tragus and mastoid, EAC's without erythema or swelling, TM's with good bony landmarks and cone of light. Non erythematous.      Nose: Nose normal.     Mouth/Throat:     Comments: Oral mucosa pink and moist, no tonsillar enlargement or exudate. Posterior pharynx patent and nonerythematous, no uvula deviation or swelling. Normal phonation.   Eyes:     Extraocular Movements: Extraocular movements intact.     Pupils: Pupils are equal, round, and reactive to light.     Comments: Bilateral eyes with mild conjunctival erythema, no periorbital swelling, no drainage noted, no photophobia with exam, anterior chamber clear  Cardiovascular:     Rate and Rhythm: Normal rate.  Pulmonary:     Effort: Pulmonary effort is normal. No respiratory distress.     Comments: Breathing comfortably at rest, CTABL, no wheezing, rales or other adventitious sounds auscultated  Abdominal:     General: There is no distension.  Musculoskeletal:        General: Normal range of motion.     Cervical back: Neck supple.  Skin:    General: Skin is warm and dry.  Neurological:     Mental Status: He is alert and oriented to person, place, and time.     UC Treatments / Results  Labs (all labs ordered are listed, but only abnormal results are displayed) Labs Reviewed - No data to display  EKG   Radiology No results found.  Procedures Procedures (including critical care time)  Medications Ordered in UC Medications - No data to display  Initial Impression / Assessment and Plan / UC Course  I have reviewed the triage vital signs and the nursing notes.  Pertinent labs & imaging results that were available during my care of the  patient were reviewed by me and considered in my medical decision making (see chart for details).     Treating for sinusitis-Augmentin, continue symptomatic and supportive care of cough and congestion, Tessalon for cough.  Olopatadine for eyes, suspect likely allergic conjunctivitis.  Discussed strict return precautions. Patient verbalized understanding and is agreeable with plan.  Final Clinical Impressions(s) / UC Diagnoses   Final diagnoses:  Acute sinusitis with symptoms > 10 days  Allergic conjunctivitis of  both eyes     Discharge Instructions      Augmentin twice daily for 1 week Daily Flonase, may add in daily cetirizine/Zyrtec or loratadine/Claritin Tessalon every 8 hours for cough Olopatadine eyedrops twice daily  Follow-up if not improving or worsening     ED Prescriptions     Medication Sig Dispense Auth. Provider   amoxicillin-clavulanate (AUGMENTIN) 875-125 MG tablet Take 1 tablet by mouth every 12 (twelve) hours for 7 days. 14 tablet Vasilia Dise C, PA-C   benzonatate (TESSALON) 200 MG capsule Take 1 capsule (200 mg total) by mouth every 8 (eight) hours. 30 capsule Wilbert Hayashi C, PA-C   olopatadine (PATANOL) 0.1 % ophthalmic solution Place 1 drop into both eyes 2 (two) times daily. 5 mL Gillermo Poch, [place] C, PA-C      PDMP not reviewed this encounter.   Janith Lima, PA-C 12/30/20 1337

## 2020-12-30 NOTE — ED Triage Notes (Signed)
Pt sts nasal congestion and itchy watery eyes x 1 week; pt sts sore throat from drainage

## 2021-01-31 ENCOUNTER — Ambulatory Visit: Payer: BC Managed Care – PPO | Attending: Internal Medicine

## 2021-01-31 DIAGNOSIS — Z20822 Contact with and (suspected) exposure to covid-19: Secondary | ICD-10-CM

## 2021-02-01 ENCOUNTER — Other Ambulatory Visit (HOSPITAL_BASED_OUTPATIENT_CLINIC_OR_DEPARTMENT_OTHER): Payer: Self-pay

## 2021-02-01 ENCOUNTER — Other Ambulatory Visit: Payer: Self-pay

## 2021-02-01 ENCOUNTER — Ambulatory Visit: Payer: BC Managed Care – PPO | Attending: Internal Medicine

## 2021-02-01 DIAGNOSIS — Z23 Encounter for immunization: Secondary | ICD-10-CM

## 2021-02-01 LAB — NOVEL CORONAVIRUS, NAA: SARS-CoV-2, NAA: NOT DETECTED

## 2021-02-01 LAB — SARS-COV-2, NAA 2 DAY TAT

## 2021-02-01 MED ORDER — PFIZER-BIONT COVID-19 VAC-TRIS 30 MCG/0.3ML IM SUSP
INTRAMUSCULAR | 0 refills | Status: DC
  Filled 2021-02-01: qty 0.3, 1d supply, fill #0

## 2021-02-01 NOTE — Progress Notes (Signed)
   Covid-19 Vaccination Clinic  Name:  Ryan Duarte    MRN: [id number] DOB: May 13, 1961  02/01/2021  Mr. Ryan Duarte was observed post Covid-19 immunization for 15 minutes without incident. He was provided with Vaccine Information Sheet and instruction to access the V-Safe system.   Mr. Ryan Duarte was instructed to call 911 with any severe reactions post vaccine: Difficulty breathing  Swelling of face and throat  A fast heartbeat  A bad rash all over body  Dizziness and weakness   Immunizations Administered     Name Date Dose VIS Date Route   PFIZER Comrnaty(Gray TOP) Covid-19 Vaccine 02/01/2021  1:00 PM 0.3 mL 06/28/2020 Intramuscular   Manufacturer: [place]   Lot: [id number]   [place]: [id number]

## 2021-04-03 ENCOUNTER — Other Ambulatory Visit: Payer: Self-pay

## 2021-12-15 ENCOUNTER — Encounter (HOSPITAL_COMMUNITY): Payer: Self-pay

## 2021-12-15 ENCOUNTER — Ambulatory Visit (HOSPITAL_COMMUNITY)
Admission: EM | Admit: 2021-12-15 | Discharge: 2021-12-15 | Disposition: A | Discharge status: Home / Self Care | Payer: BC Managed Care – PPO | Attending: Physician Assistant | Admitting: Physician Assistant

## 2021-12-15 DIAGNOSIS — J0110 Acute frontal sinusitis, unspecified: Secondary | ICD-10-CM | POA: Diagnosis not present

## 2021-12-15 DIAGNOSIS — G4489 Other headache syndrome: Secondary | ICD-10-CM | POA: Diagnosis not present

## 2021-12-15 DIAGNOSIS — R42 Dizziness and giddiness: Secondary | ICD-10-CM

## 2021-12-15 MED ORDER — TRIAMCINOLONE ACETONIDE 40 MG/ML IJ SUSP
40.0000 mg | Freq: Once | INTRAMUSCULAR | Status: AC
  Administered 2021-12-15: 40 mg via INTRAMUSCULAR

## 2021-12-15 MED ORDER — TRIAMCINOLONE ACETONIDE 40 MG/ML IJ SUSP
INTRAMUSCULAR | Status: AC
  Filled 2021-12-15: qty 1

## 2021-12-15 MED ORDER — AMOXICILLIN-POT CLAVULANATE 875-125 MG PO TABS: 1.0000 | ORAL_TABLET | Freq: Two times a day (BID) | ORAL | 0 refills | Status: DC

## 2021-12-15 NOTE — ED Triage Notes (Signed)
Patient presents to Urgent Care with complaints of migraines, ha, and Sinus pain since last week. Patient reports yesterday he felt light headed, and has pain behind L ear. Pt reports Flonase pill.

## 2021-12-15 NOTE — ED Provider Notes (Signed)
[facility]    CSN: [id number] Arrival date & time: 12/15/21  1507      History   Chief Complaint Chief Complaint  Patient presents with   Facial Pain    HPI Ryan Duarte is a 61 y.o. male.   61 year old male presents with sinus congestion and headache.  Patient relates for the past week he has had had sinus congestion, frontal and maxillary sinuses, with pressure, tenderness, mild postnasal drip and rhinitis that has purulent production.  Patient relates also he has had headache associated which is dull, constant, and posterior occipital on the left just behind the ear.  Patient relates that this is his usual migraines and where they usually occur.  Patient relates on a scale of 1-10 he relates that his migraine is about a 8.  Patient relates he is not having any vision problems, blurred vision or double vision.  Patient relates he has not had any nausea or vomiting, and no weakness of the upper or lower extremities or numbness or tingling.  Patient relates that he did have some mild dizziness that occurred when he was in the shower that lasted for several seconds and he had to brace against the wall, but this quickly resolved.  Patient has been having to take Flonase nasal spray, Sudafed cold and sinus for the past 4 days on a regular basis, and and ibuprofen for relief but this has not helped.    Past Medical History:  Diagnosis Date   GERD (gastroesophageal reflux disease)    High cholesterol    Hypertension     Patient Active Problem List   Diagnosis Date Noted   Deviated septum 01/28/2019   Allergy 10/22/2016   Left knee pain 10/22/2016   HTN (hypertension) 06/04/2011    Past Surgical History:  Procedure Laterality Date   KNEE CARTILAGE SURGERY Right    NASAL SEPTOPLASTY W/ TURBINOPLASTY Bilateral 01/28/2019   Procedure: NASAL SEPTOPLASTY WITH BILATERAL INFERIOR TURBINATE REDUCTION;  Surgeon: Jerrell Belfast, MD;  Location: [facility];  Service: ENT;   Laterality: Bilateral;       Home Medications    Prior to Admission medications   Medication Sig Start Date End Date Taking? Authorizing Provider  amoxicillin-clavulanate (AUGMENTIN) 875-125 MG tablet Take 1 tablet by mouth every 12 (twelve) hours. 12/15/21  Yes Nyoka Lint, PA-C  amLODipine (NORVASC) 5 MG tablet Take 5 mg by mouth daily.    [provider]  atorvastatin (LIPITOR) 10 MG tablet Take 10 mg by mouth daily.    [provider]  benazepril (LOTENSIN) 10 MG tablet Take 10 mg by mouth 2 (two) times daily.    [provider]  benzonatate (TESSALON) 200 MG capsule Take 1 capsule (200 mg total) by mouth every 8 (eight) hours. 12/30/20   Wieters, Hallie C, PA-C  COVID-19 mRNA Vac-TriS, Pfizer, (PFIZER-BIONT COVID-19 VAC-TRIS) SUSP injection Inject into the muscle. 02/01/21   Carlyle Basques, MD  ketoconazole (NIZORAL) 2 % cream Apply 1 application topically daily as needed for irritation.    [provider]  olopatadine (PATANOL) 0.1 % ophthalmic solution Place 1 drop into both eyes 2 (two) times daily. 12/30/20   Wieters, Hallie C, PA-C  pantoprazole (PROTONIX) 40 MG tablet Take 40 mg by mouth daily. 01/10/19   [provider]    Family History History reviewed. No pertinent family history.  Social History Social History   Tobacco Use   Smoking status: Never   Smokeless tobacco: Never  Vaping Use  Vaping Use: Never used  Substance Use Topics   Alcohol use: No   Drug use: No     Allergies   Patient has no known allergies.   Review of Systems Review of Systems  HENT:  Positive for rhinorrhea, sinus pressure and sinus pain.     Physical Exam Triage Vital Signs ED Triage Vitals  Enc Vitals Group     BP 12/15/21 1529 (!) 198/108     Pulse --      Resp 12/15/21 1529 18     Temp 12/15/21 1529 98.3 F (36.8 C)     Temp src --      SpO2 12/15/21 1529 98 %     Weight --      Height --      Head Circumference --       Peak Flow --      Pain Score 12/15/21 1527 8     Pain Loc --      Pain Edu? --      Excl. in [place]? --    No data found.  Updated Vital Signs BP (!) 198/108 (BP Location: Left Arm)   Temp 98.3 F (36.8 C)   Resp 18   SpO2 98%   Visual Acuity Right Eye Distance:   Left Eye Distance:   Bilateral Distance:    Right Eye Near:   Left Eye Near:    Bilateral Near:     Physical Exam   UC Treatments / Results  Labs (all labs ordered are listed, but only abnormal results are displayed) Labs Reviewed - No data to display  EKG   Radiology No results found.  Procedures Procedures (including critical care time)  Medications Ordered in UC Medications  triamcinolone acetonide (KENALOG-40) injection 40 mg (has no administration in time range)    Initial Impression / Assessment and Plan / UC Course  I have reviewed the triage vital signs and the nursing notes.  Pertinent labs & imaging results that were available during my care of the patient were reviewed by me and considered in my medical decision making (see chart for details).    Plan: 1.  Advised to use of Flonase nasal spray 2 sprays each nostril once a day. Advised to use Mucinex ER twice daily to help thin the secretions. Advised patient to take the medication, Augmentin as directed with food. 3.  Patient is advised to follow-up with PCP or return if symptoms fail to improve. Final Clinical Impressions(s) / UC Diagnoses   Final diagnoses:  Other headache syndrome  Acute non-recurrent frontal sinusitis  Dizziness and giddiness     Discharge Instructions      Advised to continue using Flonase 2 sprays each nostril once a day to help decrease sinus congestion. Advised to use Mucinex ER to help thin the secretions. Advised to take the antibiotic as directed. Advised to follow-up with PCP or return if symptoms fail to improve.     ED Prescriptions     Medication Sig Dispense Auth. Provider    amoxicillin-clavulanate (AUGMENTIN) 875-125 MG tablet Take 1 tablet by mouth every 12 (twelve) hours. 14 tablet Nyoka Lint, PA-C      PDMP not reviewed this encounter.   Nyoka Lint, PA-C 12/15/21 1603

## 2021-12-15 NOTE — Discharge Instructions (Signed)
Advised to continue using Flonase 2 sprays each nostril once a day to help decrease sinus congestion. Advised to use Mucinex ER to help thin the secretions. Advised to take the antibiotic as directed. Advised to follow-up with PCP or return if symptoms fail to improve.

## 2022-07-17 ENCOUNTER — Ambulatory Visit (AMBULATORY_SURGERY_CENTER): Payer: BC Managed Care – PPO | Admitting: *Deleted

## 2022-07-17 VITALS — Ht 71.0 in | Wt 207.0 lb

## 2022-07-17 DIAGNOSIS — Z1211 Encounter for screening for malignant neoplasm of colon: Secondary | ICD-10-CM

## 2022-07-17 MED ORDER — NA SULFATE-K SULFATE-MG SULF 17.5-3.13-1.6 GM/177ML PO SOLN: 1.0000 | Freq: Once | ORAL | 0 refills | Status: AC

## 2022-07-17 NOTE — Progress Notes (Signed)
No egg or soy allergy known to patient  No issues known to pt with past sedation with any surgeries or procedures Patient denies ever being told they had issues or difficulty with intubation  No FH of Malignant Hyperthermia Pt is not on diet pills Pt is not on  home 02  Pt is not on blood thinners  Pt denies issues with constipation  No A fib or A flutter Have any cardiac testing pending--NO Pt instructed to use Singlecare.com or GoodRx for a price reduction on prep    Patient's chart reviewed by Ryan Duarte CNRA prior to previsit and patient appropriate for the [facility].  Previsit completed and red dot placed by patient's name on their procedure day (on provider's schedule).    

## 2022-08-08 ENCOUNTER — Encounter: Payer: Self-pay | Admitting: Gastroenterology

## 2022-08-11 ENCOUNTER — Encounter: Payer: Self-pay | Admitting: Gastroenterology

## 2022-08-11 ENCOUNTER — Ambulatory Visit (AMBULATORY_SURGERY_CENTER): Payer: BC Managed Care – PPO | Admitting: Gastroenterology

## 2022-08-11 VITALS — BP 128/74 | HR 63 | Temp 99.6°F | Resp 21 | Ht 71.0 in | Wt 207.0 lb

## 2022-08-11 DIAGNOSIS — K635 Polyp of colon: Secondary | ICD-10-CM | POA: Diagnosis not present

## 2022-08-11 DIAGNOSIS — Z1211 Encounter for screening for malignant neoplasm of colon: Secondary | ICD-10-CM | POA: Diagnosis present

## 2022-08-11 DIAGNOSIS — D125 Benign neoplasm of sigmoid colon: Secondary | ICD-10-CM

## 2022-08-11 DIAGNOSIS — D124 Benign neoplasm of descending colon: Secondary | ICD-10-CM

## 2022-08-11 MED ORDER — SODIUM CHLORIDE 0.9 % IV SOLN: 500.0000 mL | Freq: Once | INTRAVENOUS | Status: DC

## 2022-08-11 NOTE — Progress Notes (Signed)
Called to room to assist during endoscopic procedure.  Patient ID and intended procedure confirmed with present staff. Received instructions for my participation in the procedure from the performing physician.

## 2022-08-11 NOTE — Op Note (Signed)
[facility] Patient Name: Ryan Duarte Procedure Date: 08/11/2022 9:29 AM MRN: [id number] Endoscopist: Jackquline Denmark , MD, [id number] Age: 62 Referring MD:  Date of Birth: 03-19-1961 Gender: Male Account #: [id number] Procedure:                Colonoscopy Indications:              Screening for colorectal malignant neoplasm Medicines:                Monitored Anesthesia Care Procedure:                Pre-Anesthesia Assessment:                           - Prior to the procedure, a History and Physical                            was performed, and patient medications and                            allergies were reviewed. The patient's tolerance of                            previous anesthesia was also reviewed. The risks                            and benefits of the procedure and the sedation                            options and risks were discussed with the patient.                            All questions were answered, and informed consent                            was obtained. Prior Anticoagulants: The patient has                            taken no anticoagulant or antiplatelet agents. ASA                            Grade Assessment: II - A patient with mild systemic                            disease. After reviewing the risks and benefits,                            the patient was deemed in satisfactory condition to                            undergo the procedure.                           After obtaining informed consent, the colonoscope  was passed under direct vision. Throughout the                            procedure, the patient's blood pressure, pulse, and                            oxygen saturations were monitored continuously. The                            Olympus CF-HQ190L ([id number]) Colonoscope was                            introduced through the anus and advanced to the 2                            cm into the ileum. The  colonoscopy was performed                            without difficulty. The patient tolerated the                            procedure well. The quality of the bowel                            preparation was good. The terminal ileum, ileocecal                            valve, appendiceal orifice, and rectum were                            photographed. Scope In: 9:33:56 AM Scope Out: 9:46:27 AM Scope Withdrawal Time: 0 hours 10 minutes 17 seconds  Total Procedure Duration: 0 hours 12 minutes 31 seconds  Findings:                 Hemorrhoids were found on perianal exam.                           Two sessile polyps were found in the mid sigmoid                            colon (10 mm) and mid descending colon. The polyps                            were 37m and 10 mm in size. These polyps were                            removed with a cold snare. Resection and retrieval                            were complete.                           Non-bleeding internal hemorrhoids were found during  retroflexion and during perianal exam. The                            hemorrhoids were moderate and Grade IV (internal                            hemorrhoids that prolapse and cannot be reduced                            manually).                           The terminal ileum appeared normal.                           The exam was otherwise without abnormality on                            direct and retroflexion views. Complications:            No immediate complications. Estimated Blood Loss:     Estimated blood loss: none. Impression:               - Two 6 to 10 mm polyps in the mid sigmoid colon                            and in the mid descending colon, removed with a                            cold snare. Resected and retrieved.                           - Non-bleeding internal hemorrhoids.                           - The examined portion of the ileum was normal.                            - The examination was otherwise normal on direct                            and retroflexion views. Recommendation:           - Patient has a contact number available for                            emergencies. The signs and symptoms of potential                            delayed complications were discussed with the                            patient. Return to normal activities tomorrow.                            Written discharge instructions were provided to  the                            patient.                           - High fiber diet.                           - Continue present medications.                           - Await pathology results.                           - Repeat colonoscopy for surveillance based on                            pathology results.                           - If any problems with hemorrhoids, use Preparation                            H/sitz baths. If still with problems, would                            recommend surgical evaluation for EUA/                            hemorrhoidectomy.                           - Return to GI clinic PRN.                           - The findings and recommendations were discussed                            with the patient's family. Jackquline Denmark, MD 08/11/2022 9:52:33 AM This report has been signed electronically.

## 2022-08-11 NOTE — Progress Notes (Signed)
Pt resting comfortably. VSS. Airway intact. SBAR complete to RN. All questions answered.   

## 2022-08-11 NOTE — Progress Notes (Signed)
VS by DT  Pt's states no medical or surgical changes since previsit or office visit.  

## 2022-08-11 NOTE — Progress Notes (Signed)
[facility] History and Physical   Primary Care Physician:  [facility]   Reason for Procedure:   Crc  screening  Plan:    colon     HPI: Ryan Duarte is a 62 y.o. male  No nausea, vomiting, heartburn, regurgitation, odynophagia or dysphagia.  No significant diarrhea or constipation.  No melena or hematochezia. No unintentional weight loss. No abdominal pain.   Past Medical History:  Diagnosis Date   Allergy    SEASONAL   GERD (gastroesophageal reflux disease)    High cholesterol    Hypertension    Sleep apnea    WEAR C PAP    Past Surgical History:  Procedure Laterality Date   KNEE CARTILAGE SURGERY Right    NASAL SEPTOPLASTY W/ TURBINOPLASTY Bilateral 01/28/2019   Procedure: NASAL SEPTOPLASTY WITH BILATERAL INFERIOR TURBINATE REDUCTION;  Surgeon: Jerrell Belfast, MD;  Location: [facility];  Service: ENT;  Laterality: Bilateral;    Prior to Admission medications   Medication Sig Start Date End Date Taking? Authorizing Provider  amLODipine (NORVASC) 5 MG tablet Take 5 mg by mouth daily.   Yes [provider]  atorvastatin (LIPITOR) 10 MG tablet Take 10 mg by mouth daily.   Yes [provider]  benazepril (LOTENSIN) 10 MG tablet Take 10 mg by mouth 2 (two) times daily.   Yes [provider]  ketoconazole (NIZORAL) 2 % cream Apply 1 application topically daily as needed for irritation.   Yes [provider]  omeprazole (PRILOSEC OTC) 20 MG tablet Take 20 mg by mouth daily.   Yes [provider]  fluticasone (FLONASE) 50 MCG/ACT nasal spray as needed. 08/14/14   [provider]  pantoprazole (PROTONIX) 40 MG tablet Take 40 mg by mouth as needed. Patient not taking: Reported on 08/11/2022 01/10/19   [provider]    Current Outpatient Medications  Medication Sig Dispense Refill   amLODipine (NORVASC) 5 MG tablet Take 5 mg by mouth daily.     atorvastatin (LIPITOR) 10 MG  tablet Take 10 mg by mouth daily.     benazepril (LOTENSIN) 10 MG tablet Take 10 mg by mouth 2 (two) times daily.     ketoconazole (NIZORAL) 2 % cream Apply 1 application topically daily as needed for irritation.     omeprazole (PRILOSEC OTC) 20 MG tablet Take 20 mg by mouth daily.     fluticasone (FLONASE) 50 MCG/ACT nasal spray as needed.     pantoprazole (PROTONIX) 40 MG tablet Take 40 mg by mouth as needed. (Patient not taking: Reported on 08/11/2022)     Current Facility-Administered Medications  Medication Dose Route Frequency Provider Last Rate Last Admin   0.9 %  sodium chloride infusion  500 mL Intravenous Once Jackquline Denmark, MD        Allergies as of 08/11/2022   (No Known Allergies)    Family History  Problem Relation Age of Onset   Colon cancer Neg Hx    Colon polyps Neg Hx    Crohn's disease Neg Hx    Esophageal cancer Neg Hx    Rectal cancer Neg Hx    Ulcerative colitis Neg Hx    Stomach cancer Neg Hx     Social History   Socioeconomic History   Marital status: Married    Spouse name: Not on file   Number of children: Not on file   Years of education: Not on file   Highest education level: Not on file  Occupational History   Not on file  Tobacco Use   Smoking status: Never    Passive exposure: Never   Smokeless tobacco: Never  Vaping Use   Vaping Use: Never used  Substance and Sexual Activity   Alcohol use: No   Drug use: No   Sexual activity: Not on file  Other Topics Concern   Not on file  Social History Narrative   Not on file   Social Determinants of Health   Financial Resource Strain: Not on file  Food Insecurity: Not on file  Transportation Needs: Not on file  Physical Activity: Not on file  Stress: Not on file  Social Connections: Not on file  Intimate Partner Violence: Not on file    Review of Systems: Positive for none All other review of systems negative except as mentioned in the HPI.  Physical Exam: Vital signs in last 24  hours: '@VSRANGES'$ @   General:   Alert,  Well-developed, well-nourished, pleasant and cooperative in NAD Lungs:  Clear throughout to auscultation.   Heart:  Regular rate and rhythm; no murmurs, clicks, rubs,  or gallops. Abdomen:  Soft, nontender and nondistended. Normal bowel sounds.   Neuro/Psych:  Alert and cooperative. Normal mood and affect. A and O x 3    No significant changes were identified.  The patient continues to be an appropriate candidate for the planned procedure and anesthesia.   Carmell Austria, MD. [facility] 08/11/2022 9:25 AM@

## 2022-08-11 NOTE — Patient Instructions (Signed)

## 2022-08-12 ENCOUNTER — Telehealth: Payer: Self-pay

## 2022-08-12 NOTE — Telephone Encounter (Signed)
No answer, left message to call if having any issues or concerns, B.Jhoselyn Ruffini, RN.

## 2022-08-12 NOTE — Telephone Encounter (Signed)
Incoming call from patient returning call. Patient states he is feeling fine and has no issues or concerns.

## 2022-08-18 ENCOUNTER — Encounter: Payer: Self-pay | Admitting: Gastroenterology

## 2022-09-28 ENCOUNTER — Ambulatory Visit (INDEPENDENT_AMBULATORY_CARE_PROVIDER_SITE_OTHER): Payer: BC Managed Care – PPO

## 2022-09-28 ENCOUNTER — Ambulatory Visit (HOSPITAL_COMMUNITY)
Admission: EM | Admit: 2022-09-28 | Discharge: 2022-09-28 | Disposition: A | Discharge status: Home / Self Care | Payer: BC Managed Care – PPO | Attending: Family Medicine | Admitting: Family Medicine

## 2022-09-28 ENCOUNTER — Encounter (HOSPITAL_COMMUNITY): Payer: Self-pay

## 2022-09-28 DIAGNOSIS — M79671 Pain in right foot: Secondary | ICD-10-CM

## 2022-09-28 MED ORDER — KETOROLAC TROMETHAMINE 30 MG/ML IJ SOLN: 30.0000 mg | Freq: Once | INTRAMUSCULAR | Status: DC

## 2022-09-28 MED ORDER — TRAMADOL HCL 50 MG PO TABS: 50.0000 mg | ORAL_TABLET | Freq: Four times a day (QID) | ORAL | 0 refills | Status: DC | PRN

## 2022-09-28 NOTE — Discharge Instructions (Signed)
Your x-ray does not show any broken bones  You can take Tylenol as needed for pain.  Take the extra strength 500 mg tablets-2 every 6 hours as needed for pain or fever.  Take tramadol 50 mg-- 1 tablet every 6 hours as needed for pain.  This medication can make you sleepy or dizzy

## 2022-09-28 NOTE — ED Provider Notes (Signed)
[place]    CSN: [id number] Arrival date & time: 09/28/22  1326      History   Chief Complaint Chief Complaint  Patient presents with   Foot Pain    HPI Ryan Duarte is a 62 y.o. male.    Foot Pain   Here for pain around his first MTP joint on his right foot.  About 3 weeks ago he was trying to corral their pet dog, when his right foot struck a face.  Ibuprofen has helped some but when he wears certain shoes it hurts him more again.  It did swell at first and has improved some but it is still a little swollen.  Past Medical History:  Diagnosis Date   Allergy    SEASONAL   GERD (gastroesophageal reflux disease)    High cholesterol    Hypertension    Sleep apnea    WEAR C PAP    Patient Active Problem List   Diagnosis Date Noted   Deviated septum 01/28/2019   Allergy 10/22/2016   Left knee pain 10/22/2016   HTN (hypertension) 06/04/2011    Past Surgical History:  Procedure Laterality Date   KNEE CARTILAGE SURGERY Right    NASAL SEPTOPLASTY W/ TURBINOPLASTY Bilateral 01/28/2019   Procedure: NASAL SEPTOPLASTY WITH BILATERAL INFERIOR TURBINATE REDUCTION;  Surgeon: Jerrell Belfast, MD;  Location: [facility];  Service: ENT;  Laterality: Bilateral;       Home Medications    Prior to Admission medications   Medication Sig Start Date End Date Taking? Authorizing Provider  amLODipine (NORVASC) 5 MG tablet Take 5 mg by mouth daily.   Yes [provider]  atorvastatin (LIPITOR) 10 MG tablet Take 10 mg by mouth daily.   Yes [provider]  benazepril (LOTENSIN) 10 MG tablet Take 10 mg by mouth 2 (two) times daily.   Yes [provider]  ketoconazole (NIZORAL) 2 % cream Apply 1 application topically daily as needed for irritation.   Yes [provider]  omeprazole (PRILOSEC OTC) 20 MG tablet Take 20 mg by mouth daily.   Yes [provider]  traMADol (ULTRAM) 50 MG tablet Take 1 tablet (50 mg total) by mouth  every 6 (six) hours as needed (pain). 09/28/22  Yes Barrett Henle, MD  fluticasone (FLONASE) 50 MCG/ACT nasal spray as needed. 08/14/14   [provider]    Family History Family History  Problem Relation Age of Onset   Colon cancer Neg Hx    Colon polyps Neg Hx    Crohn's disease Neg Hx    Esophageal cancer Neg Hx    Rectal cancer Neg Hx    Ulcerative colitis Neg Hx    Stomach cancer Neg Hx     Social History Social History   Tobacco Use   Smoking status: Never    Passive exposure: Never   Smokeless tobacco: Never  Vaping Use   Vaping Use: Never used  Substance Use Topics   Alcohol use: No   Drug use: No     Allergies   Patient has no known allergies.   Review of Systems Review of Systems   Physical Exam Triage Vital Signs ED Triage Vitals  Enc Vitals Group     BP 09/28/22 1537 136/85     Pulse Rate 09/28/22 1537 84     Resp 09/28/22 1537 20     Temp 09/28/22 1537 98.4 F (36.9 C)     Temp Source 09/28/22 1537 Oral  SpO2 09/28/22 1537 98 %     Weight 09/28/22 1536 205 lb (93 kg)     Height 09/28/22 1536 '5\' 11"'$  (1.803 m)     Head Circumference --      Peak Flow --      Pain Score 09/28/22 1536 4     Pain Loc --      Pain Edu? --      Excl. in [place]? --    No data found.  Updated Vital Signs BP 136/85 (BP Location: Right Arm)   Pulse 84   Temp 98.4 F (36.9 C) (Oral)   Resp 20   Ht '5\' 11"'$  (1.803 m)   Wt 93 kg   SpO2 98%   BMI 28.59 kg/m   Visual Acuity Right Eye Distance:   Left Eye Distance:   Bilateral Distance:    Right Eye Near:   Left Eye Near:    Bilateral Near:     Physical Exam Vitals reviewed.  Constitutional:      General: He is not in acute distress.    Appearance: He is not ill-appearing, toxic-appearing or diaphoretic.  Musculoskeletal:     Comments: There is a little swelling around the first MTP joint of the right foot.  It is tender laterally.  Capillary refill is normal and neurologic exam is normal.   Neurological:     General: No focal deficit present.     Mental Status: He is alert and oriented to person, place, and time.  Psychiatric:        Behavior: Behavior normal.      UC Treatments / Results  Labs (all labs ordered are listed, but only abnormal results are displayed) Labs Reviewed - No data to display  EKG   Radiology DG Foot Complete Right  Result Date: 09/28/2022 CLINICAL DATA:  First metatarsophalangeal joint pain. Injury 3 weeks ago. EXAM: RIGHT FOOT COMPLETE - 3+ VIEW COMPARISON:  None Available. FINDINGS: There is no evidence of fracture or dislocation. There is no evidence of arthropathy or other focal bone abnormality. Soft tissues are unremarkable. IMPRESSION: Negative. Electronically Signed   By: Ronney Asters M.D.   On: 09/28/2022 16:19    Procedures Procedures (including critical care time)  Medications Ordered in UC Medications - No data to display  Initial Impression / Assessment and Plan / UC Course  I have reviewed the triage vital signs and the nursing notes.  Pertinent labs & imaging results that were available during my care of the patient were reviewed by me and considered in my medical decision making (see chart for details).        X-ray does not show any acute bony abnormality.  I think there is low likelihood that this has anything to do with gout since it began with an injury.  Postop shoe is supplied.  Contact information for podiatry is given.  I was going to send in ibuprofen 800 and give him Toradol, but then I saw that his creatinine in care everywhere in late 2023 was elevated at 1.5-1.7.  His EGFR ranges from 44-50.  I discussed with him these numbers and what medications he should not be taking.  Tramadol is sent in and he will take Tylenol also over-the-counter. Final Clinical Impressions(s) / UC Diagnoses   Final diagnoses:  Foot pain, right     Discharge Instructions      Your x-ray does not show any broken  bones  You can take Tylenol as needed for pain.  Take the extra strength 500 mg tablets-2 every 6 hours as needed for pain or fever.  Take tramadol 50 mg-- 1 tablet every 6 hours as needed for pain.  This medication can make you sleepy or dizzy        ED Prescriptions     Medication Sig Dispense Auth. Provider   traMADol (ULTRAM) 50 MG tablet Take 1 tablet (50 mg total) by mouth every 6 (six) hours as needed (pain). 12 tablet Caellum Mancil, Gwenlyn Perking, MD      I have reviewed the PDMP during this encounter.   Barrett Henle, MD 09/28/22 [id number]

## 2022-09-28 NOTE — ED Triage Notes (Signed)
Patient here today for right foot pain X 2-3 week after chasing after a dog and hitting his foot on something. Has been taken IBU and Hemp arthritis gel which does help with the pain some. He does have a h/o of a hair line fracture. Pain has not improved over the last 2 weeks.

## 2023-05-31 ENCOUNTER — Observation Stay (HOSPITAL_BASED_OUTPATIENT_CLINIC_OR_DEPARTMENT_OTHER)
Admission: EM | Admit: 2023-05-31 | Discharge: 2023-06-01 | Disposition: A | Discharge status: Home / Self Care | Payer: BC Managed Care – PPO | Attending: Internal Medicine | Admitting: Internal Medicine

## 2023-05-31 ENCOUNTER — Observation Stay (HOSPITAL_COMMUNITY): Payer: BC Managed Care – PPO

## 2023-05-31 ENCOUNTER — Encounter (HOSPITAL_BASED_OUTPATIENT_CLINIC_OR_DEPARTMENT_OTHER): Payer: Self-pay | Admitting: Emergency Medicine

## 2023-05-31 ENCOUNTER — Emergency Department (HOSPITAL_BASED_OUTPATIENT_CLINIC_OR_DEPARTMENT_OTHER): Payer: BC Managed Care – PPO

## 2023-05-31 DIAGNOSIS — N1831 Chronic kidney disease, stage 3a: Secondary | ICD-10-CM | POA: Diagnosis not present

## 2023-05-31 DIAGNOSIS — Z79899 Other long term (current) drug therapy: Secondary | ICD-10-CM | POA: Diagnosis not present

## 2023-05-31 DIAGNOSIS — E7800 Pure hypercholesterolemia, unspecified: Secondary | ICD-10-CM | POA: Diagnosis not present

## 2023-05-31 DIAGNOSIS — R202 Paresthesia of skin: Secondary | ICD-10-CM | POA: Diagnosis not present

## 2023-05-31 DIAGNOSIS — Z8673 Personal history of transient ischemic attack (TIA), and cerebral infarction without residual deficits: Secondary | ICD-10-CM | POA: Diagnosis not present

## 2023-05-31 DIAGNOSIS — R200 Anesthesia of skin: Secondary | ICD-10-CM | POA: Diagnosis not present

## 2023-05-31 DIAGNOSIS — I639 Cerebral infarction, unspecified: Secondary | ICD-10-CM

## 2023-05-31 DIAGNOSIS — Z6828 Body mass index (BMI) 28.0-28.9, adult: Secondary | ICD-10-CM | POA: Diagnosis not present

## 2023-05-31 DIAGNOSIS — N183 Chronic kidney disease, stage 3 unspecified: Secondary | ICD-10-CM | POA: Diagnosis present

## 2023-05-31 DIAGNOSIS — I10 Essential (primary) hypertension: Secondary | ICD-10-CM | POA: Diagnosis present

## 2023-05-31 DIAGNOSIS — D519 Vitamin B12 deficiency anemia, unspecified: Secondary | ICD-10-CM | POA: Insufficient documentation

## 2023-05-31 DIAGNOSIS — E785 Hyperlipidemia, unspecified: Secondary | ICD-10-CM | POA: Diagnosis not present

## 2023-05-31 DIAGNOSIS — G4733 Obstructive sleep apnea (adult) (pediatric): Secondary | ICD-10-CM | POA: Insufficient documentation

## 2023-05-31 DIAGNOSIS — Z7901 Long term (current) use of anticoagulants: Secondary | ICD-10-CM | POA: Diagnosis not present

## 2023-05-31 DIAGNOSIS — I6381 Other cerebral infarction due to occlusion or stenosis of small artery: Secondary | ICD-10-CM

## 2023-05-31 DIAGNOSIS — K219 Gastro-esophageal reflux disease without esophagitis: Secondary | ICD-10-CM | POA: Diagnosis not present

## 2023-05-31 DIAGNOSIS — I129 Hypertensive chronic kidney disease with stage 1 through stage 4 chronic kidney disease, or unspecified chronic kidney disease: Secondary | ICD-10-CM | POA: Insufficient documentation

## 2023-05-31 DIAGNOSIS — I159 Secondary hypertension, unspecified: Secondary | ICD-10-CM | POA: Diagnosis not present

## 2023-05-31 DIAGNOSIS — E663 Overweight: Secondary | ICD-10-CM | POA: Insufficient documentation

## 2023-05-31 DIAGNOSIS — G4730 Sleep apnea, unspecified: Secondary | ICD-10-CM | POA: Diagnosis present

## 2023-05-31 HISTORY — DX: Chronic kidney disease, stage 3 unspecified: N18.30

## 2023-05-31 LAB — DIFFERENTIAL
Abs Immature Granulocytes: 0.02 10*3/uL (ref 0.00–0.07)
Basophils Absolute: 0 10*3/uL (ref 0.0–0.1)
Basophils Relative: 0 %
Eosinophils Absolute: 0 10*3/uL (ref 0.0–0.5)
Eosinophils Relative: 0 %
Immature Granulocytes: 0 %
Lymphocytes Relative: 18 %
Lymphs Abs: 1.3 10*3/uL (ref 0.7–4.0)
Monocytes Absolute: 0.6 10*3/uL (ref 0.1–1.0)
Monocytes Relative: 9 %
Neutro Abs: 5 10*3/uL (ref 1.7–7.7)
Neutrophils Relative %: 73 %

## 2023-05-31 LAB — HEMOGLOBIN A1C
Hgb A1c MFr Bld: 5.6 % (ref 4.8–5.6)
Mean Plasma Glucose: 114.02 mg/dL

## 2023-05-31 LAB — CBC
HCT: 44.4 % (ref 39.0–52.0)
Hemoglobin: 15.1 g/dL (ref 13.0–17.0)
MCH: 28.8 pg (ref 26.0–34.0)
MCHC: 34 g/dL (ref 30.0–36.0)
MCV: 84.6 fL (ref 80.0–100.0)
Platelets: 295 10*3/uL (ref 150–400)
RBC: 5.25 MIL/uL (ref 4.22–5.81)
RDW: 15.7 % — ABNORMAL HIGH (ref 11.5–15.5)
WBC: 6.9 10*3/uL (ref 4.0–10.5)
nRBC: 0 % (ref 0.0–0.2)

## 2023-05-31 LAB — VITAMIN B12: Vitamin B-12: 220 pg/mL (ref 180–914)

## 2023-05-31 LAB — COMPREHENSIVE METABOLIC PANEL
ALT: 17 U/L (ref 0–44)
AST: 16 U/L (ref 15–41)
Albumin: 4.4 g/dL (ref 3.5–5.0)
Alkaline Phosphatase: 61 U/L (ref 38–126)
Anion gap: 8 (ref 5–15)
BUN: 15 mg/dL (ref 6–20)
CO2: 26 mmol/L (ref 22–32)
Calcium: 9.8 mg/dL (ref 8.9–10.3)
Chloride: 102 mmol/L (ref 98–111)
Creatinine, Ser: 1.46 mg/dL — ABNORMAL HIGH (ref 0.61–1.24)
GFR, Estimated: 55 mL/min — ABNORMAL LOW (ref >60)
Glucose, Bld: 104 mg/dL — ABNORMAL HIGH (ref 70–99)
Potassium: 4.2 mmol/L (ref 3.5–5.1)
Sodium: 136 mmol/L (ref 135–145)
Total Bilirubin: 0.5 mg/dL (ref <1.2)
Total Protein: 8.3 g/dL — ABNORMAL HIGH (ref 6.5–8.1)

## 2023-05-31 LAB — LIPID PANEL
Cholesterol: 252 mg/dL — ABNORMAL HIGH (ref 0–200)
HDL: 46 mg/dL (ref >40)
LDL Cholesterol: 186 mg/dL — ABNORMAL HIGH (ref 0–99)
Total CHOL/HDL Ratio: 5.5 {ratio}
Triglycerides: 99 mg/dL (ref <150)
VLDL: 20 mg/dL (ref 0–40)

## 2023-05-31 LAB — RAPID URINE DRUG SCREEN, HOSP PERFORMED
Amphetamines: NOT DETECTED
Barbiturates: NOT DETECTED
Benzodiazepines: NOT DETECTED
Cocaine: NOT DETECTED
Opiates: NOT DETECTED
Tetrahydrocannabinol: NOT DETECTED

## 2023-05-31 LAB — PROTIME-INR
INR: 1.1 (ref 0.8–1.2)
Prothrombin Time: 14.1 s (ref 11.4–15.2)

## 2023-05-31 LAB — APTT: aPTT: 30 s (ref 24–36)

## 2023-05-31 LAB — HIV ANTIBODY (ROUTINE TESTING W REFLEX): HIV Screen 4th Generation wRfx: NONREACTIVE

## 2023-05-31 LAB — MAGNESIUM: Magnesium: 1.9 mg/dL (ref 1.7–2.4)

## 2023-05-31 LAB — TSH: TSH: 0.885 u[IU]/mL (ref 0.350–4.500)

## 2023-05-31 LAB — ETHANOL: Alcohol, Ethyl (B): <10 mg/dL (ref <10)

## 2023-05-31 MED ORDER — ACETAMINOPHEN 325 MG PO TABS: 650.0000 mg | ORAL_TABLET | ORAL | Status: DC | PRN

## 2023-05-31 MED ORDER — ACETAMINOPHEN 160 MG/5ML PO SOLN: 650.0000 mg | ORAL | Status: DC | PRN

## 2023-05-31 MED ORDER — ENOXAPARIN SODIUM 40 MG/0.4ML IJ SOSY
40.0000 mg | PREFILLED_SYRINGE | INTRAMUSCULAR | Status: DC
  Administered 2023-05-31: 40 mg via SUBCUTANEOUS
  Filled 2023-05-31: qty 0.4

## 2023-05-31 MED ORDER — SODIUM CHLORIDE 0.9 % IV SOLN: INTRAVENOUS | Status: DC

## 2023-05-31 MED ORDER — ACETAMINOPHEN 650 MG RE SUPP: 650.0000 mg | RECTAL | Status: DC | PRN

## 2023-05-31 MED ORDER — ATORVASTATIN CALCIUM 10 MG PO TABS
10.0000 mg | ORAL_TABLET | Freq: Every day | ORAL | Status: DC
  Filled 2023-05-31: qty 1

## 2023-05-31 MED ORDER — CLOPIDOGREL BISULFATE 75 MG PO TABS
75.0000 mg | ORAL_TABLET | Freq: Every day | ORAL | Status: DC
  Administered 2023-05-31 – 2023-06-01 (×2): 75 mg via ORAL
  Filled 2023-05-31 (×2): qty 1

## 2023-05-31 MED ORDER — IOHEXOL 350 MG/ML SOLN
75.0000 mL | Freq: Once | INTRAVENOUS | Status: AC | PRN
  Administered 2023-05-31: 75 mL via INTRAVENOUS

## 2023-05-31 MED ORDER — ASPIRIN 81 MG PO TBEC
81.0000 mg | DELAYED_RELEASE_TABLET | Freq: Every day | ORAL | Status: DC
  Administered 2023-06-01: 81 mg via ORAL
  Filled 2023-05-31: qty 1

## 2023-05-31 MED ORDER — SENNOSIDES-DOCUSATE SODIUM 8.6-50 MG PO TABS: 1.0000 | ORAL_TABLET | Freq: Every evening | ORAL | Status: DC | PRN

## 2023-05-31 MED ORDER — STROKE: EARLY STAGES OF RECOVERY BOOK
Freq: Once | Status: AC
  Filled 2023-05-31 (×2): qty 1

## 2023-05-31 MED ORDER — ATORVASTATIN CALCIUM 80 MG PO TABS
80.0000 mg | ORAL_TABLET | Freq: Every day | ORAL | Status: DC
  Administered 2023-05-31: 80 mg via ORAL
  Filled 2023-05-31: qty 1

## 2023-05-31 MED ORDER — HYDRALAZINE HCL 20 MG/ML IJ SOLN: 10.0000 mg | INTRAMUSCULAR | Status: DC | PRN

## 2023-05-31 NOTE — ED Notes (Signed)
Called [facility] for transport, pt bed assignment ready

## 2023-05-31 NOTE — Plan of Care (Signed)
Problem: Education: Goal: Knowledge of General Education information will improve Description: Including pain rating scale, medication(s)/side effects and non-pharmacologic comfort measures 05/31/2023 1759 by Waynette Buttery, RN Outcome: Progressing 05/31/2023 1734 by Waynette Buttery, RN Outcome: Progressing 05/31/2023 1653 by Waynette Buttery, RN Outcome: Progressing   Problem: Health Behavior/Discharge Planning: Goal: Ability to manage health-related needs will improve 05/31/2023 1759 by Waynette Buttery, RN Outcome: Progressing 05/31/2023 1734 by Waynette Buttery, RN Outcome: Progressing 05/31/2023 1653 by Waynette Buttery, RN Outcome: Progressing   Problem: Clinical Measurements: Goal: Ability to maintain clinical measurements within normal limits will improve 05/31/2023 1759 by Waynette Buttery, RN Outcome: Progressing 05/31/2023 1734 by Waynette Buttery, RN Outcome: Progressing 05/31/2023 1653 by Waynette Buttery, RN Outcome: Progressing Goal: Will remain free from infection 05/31/2023 1759 by Waynette Buttery, RN Outcome: Progressing 05/31/2023 1734 by Waynette Buttery, RN Outcome: Progressing 05/31/2023 1653 by Waynette Buttery, RN Outcome: Progressing Goal: Diagnostic test results will improve 05/31/2023 1759 by Waynette Buttery, RN Outcome: Progressing 05/31/2023 1734 by Waynette Buttery, RN Outcome: Progressing 05/31/2023 1653 by Waynette Buttery, RN Outcome: Progressing Goal: Respiratory complications will improve 05/31/2023 1759 by Waynette Buttery, RN Outcome: Progressing 05/31/2023 1734 by Waynette Buttery, RN Outcome: Progressing 05/31/2023 1653 by Waynette Buttery, RN Outcome: Progressing Goal: Cardiovascular complication will be avoided 05/31/2023 1759 by Waynette Buttery, RN Outcome: Progressing 05/31/2023 1734 by Waynette Buttery, RN Outcome: Progressing 05/31/2023 1653 by Waynette Buttery, RN Outcome: Progressing   Problem: Activity: Goal: Risk for activity intolerance will decrease 05/31/2023  1759 by Waynette Buttery, RN Outcome: Progressing 05/31/2023 1734 by Waynette Buttery, RN Outcome: Progressing 05/31/2023 1653 by Waynette Buttery, RN Outcome: Progressing   Problem: Nutrition: Goal: Adequate nutrition will be maintained 05/31/2023 1759 by Waynette Buttery, RN Outcome: Progressing 05/31/2023 1734 by Waynette Buttery, RN Outcome: Progressing 05/31/2023 1653 by Waynette Buttery, RN Outcome: Progressing   Problem: Coping: Goal: Level of anxiety will decrease 05/31/2023 1759 by Waynette Buttery, RN Outcome: Progressing 05/31/2023 1734 by Waynette Buttery, RN Outcome: Progressing 05/31/2023 1653 by Waynette Buttery, RN Outcome: Progressing   Problem: Elimination: Goal: Will not experience complications related to bowel motility 05/31/2023 1759 by Waynette Buttery, RN Outcome: Progressing 05/31/2023 1734 by Waynette Buttery, RN Outcome: Progressing 05/31/2023 1653 by Waynette Buttery, RN Outcome: Progressing Goal: Will not experience complications related to urinary retention 05/31/2023 1759 by Waynette Buttery, RN Outcome: Progressing 05/31/2023 1734 by Waynette Buttery, RN Outcome: Progressing 05/31/2023 1653 by Waynette Buttery, RN Outcome: Progressing   Problem: Pain Management: Goal: General experience of comfort will improve 05/31/2023 1759 by Waynette Buttery, RN Outcome: Progressing 05/31/2023 1734 by Waynette Buttery, RN Outcome: Progressing 05/31/2023 1653 by Waynette Buttery, RN Outcome: Progressing   Problem: Safety: Goal: Ability to remain free from injury will improve 05/31/2023 1759 by Waynette Buttery, RN Outcome: Progressing 05/31/2023 1734 by Waynette Buttery, RN Outcome: Progressing 05/31/2023 1653 by Waynette Buttery, RN Outcome: Progressing   Problem: Skin Integrity: Goal: Risk for impaired skin integrity will decrease 05/31/2023 1759 by Waynette Buttery, RN Outcome: Progressing 05/31/2023 1734 by Waynette Buttery, RN Outcome: Progressing 05/31/2023 1653 by Waynette Buttery, RN Outcome:  Progressing   Problem: Education: Goal: Knowledge of disease or condition will improve 05/31/2023 1759 by Waynette Buttery, RN Outcome: Progressing 05/31/2023 1734 by Waynette Buttery, RN  Outcome: Progressing Goal: Knowledge of secondary prevention will improve (MUST DOCUMENT ALL) 05/31/2023 1759 by Waynette Buttery, RN Outcome: Progressing 05/31/2023 1734 by Waynette Buttery, RN Outcome: Progressing Goal: Knowledge of patient specific risk factors will improve Loraine Leriche N/A or DELETE if not current risk factor) 05/31/2023 1759 by Waynette Buttery, RN Outcome: Progressing 05/31/2023 1734 by Waynette Buttery, RN Outcome: Progressing   Problem: Ischemic Stroke/TIA Tissue Perfusion: Goal: Complications of ischemic stroke/TIA will be minimized 05/31/2023 1759 by Waynette Buttery, RN Outcome: Progressing 05/31/2023 1734 by Waynette Buttery, RN Outcome: Progressing   Problem: Coping: Goal: Will verbalize positive feelings about self 05/31/2023 1759 by Waynette Buttery, RN Outcome: Progressing 05/31/2023 1734 by Waynette Buttery, RN Outcome: Progressing Goal: Will identify appropriate support needs 05/31/2023 1759 by Waynette Buttery, RN Outcome: Progressing 05/31/2023 1734 by Waynette Buttery, RN Outcome: Progressing   Problem: Health Behavior/Discharge Planning: Goal: Ability to manage health-related needs will improve 05/31/2023 1759 by Waynette Buttery, RN Outcome: Progressing 05/31/2023 1734 by Waynette Buttery, RN Outcome: Progressing Goal: Goals will be collaboratively established with patient/family 05/31/2023 1759 by Waynette Buttery, RN Outcome: Progressing 05/31/2023 1734 by Waynette Buttery, RN Outcome: Progressing   Problem: Self-Care: Goal: Ability to participate in self-care as condition permits will improve 05/31/2023 1759 by Waynette Buttery, RN Outcome: Progressing 05/31/2023 1734 by Waynette Buttery, RN Outcome: Progressing Goal: Verbalization of feelings and concerns over difficulty with self-care will  improve 05/31/2023 1759 by Waynette Buttery, RN Outcome: Progressing 05/31/2023 1734 by Waynette Buttery, RN Outcome: Progressing Goal: Ability to communicate needs accurately will improve 05/31/2023 1759 by Waynette Buttery, RN Outcome: Progressing 05/31/2023 1734 by Waynette Buttery, RN Outcome: Progressing   Problem: Nutrition: Goal: Risk of aspiration will decrease 05/31/2023 1759 by Waynette Buttery, RN Outcome: Progressing 05/31/2023 1734 by Waynette Buttery, RN Outcome: Progressing Goal: Dietary intake will improve 05/31/2023 1759 by Waynette Buttery, RN Outcome: Progressing 05/31/2023 1734 by Waynette Buttery, RN Outcome: Progressing

## 2023-05-31 NOTE — ED Triage Notes (Signed)
Pt c/o numbness in L face, L arm and L leg since about 0500 this am upon awakening. Tingling in L cheek, L lower arm and fingertips, L lower leg and toes. Pt also states "he could be dehydrated".

## 2023-05-31 NOTE — H&P (Signed)
History and Physical    Patient: Ryan Duarte OHY:[id number] DOB: [birth year] DOA: 05/31/2023 DOS: the patient was seen and examined on 05/31/2023 PCP: [facility]  Patient coming from: Transfer from [facility]  Chief Complaint:  Chief Complaint  Patient presents with   Numbness   Tingling   HPI: Ryan Duarte is a 62 y.o. male with medical history significant of hypertension, hyperlipidemia, chronic kidney disease stage IIIa, seasonal allergies, GERD, and OSA on CPAP who presents with complaints of numbness and tingling.  Patient reported feeling in his normal state of health prior to going to sleep last night.  He woke up at 1 AM and felt fine, sometime between 4-5 a.m. to use the bathroom and felt like he had a cramp in his left calf.  He took some over-the-counter cramp medicine that he had available and then went back to sleep.  An hour later he woke up and as he was getting out of bed touch his left foot which he reported felt tingly like a pins and needle sensation and as he touched his arm noting that it felt similar.  In touching his face patient reported that underneath his eye and down his face on the left side felt numb.  Denied having any fever, chills, cough, palpitations, chest pain, focal weakness, change in speech, or changes in vision.  He did report feeling off balance after getting out of bed, but denied having any falls.  He does not smoke cigarettes and does not drink alcohol.  No prior history of strokes.  Patient does report family history of hypertension, stroke in his father, and stroke in a maternal grandparent.  In the ED patient was noted to be afebrile with blood pressures elevated up to 178/93, and all other vital signs maintained.  Labs were relatively unremarkable with creatinine noted 1.46, but appears improved from prior.  CT scan of the brain did not note any acute abnormality.  Case have been discussed with Dr. Amada Jupiter of  neurology who recommended transfer for completion of stroke workup.  Review of Systems: As mentioned in the history of present illness. All other systems reviewed and are negative. Past Medical History:  Diagnosis Date   Allergy    SEASONAL   GERD (gastroesophageal reflux disease)    High cholesterol    Hypertension    Sleep apnea    WEAR C PAP   Past Surgical History:  Procedure Laterality Date   KNEE CARTILAGE SURGERY Right    NASAL SEPTOPLASTY W/ TURBINOPLASTY Bilateral 01/28/2019   Procedure: NASAL SEPTOPLASTY WITH BILATERAL INFERIOR TURBINATE REDUCTION;  Surgeon: Osborn Coho, MD;  Location: [facility] OR;  Service: ENT;  Laterality: Bilateral;   Social History:  reports that he has never smoked. He has never been exposed to tobacco smoke. He has never used smokeless tobacco. He reports that he does not drink alcohol and does not use drugs.  No Known Allergies  Family History  Problem Relation Age of Onset   Colon cancer Neg Hx    Colon polyps Neg Hx    Crohn's disease Neg Hx    Esophageal cancer Neg Hx    Rectal cancer Neg Hx    Ulcerative colitis Neg Hx    Stomach cancer Neg Hx     Prior to Admission medications   Medication Sig Start Date End Date Taking? Authorizing Provider  amLODipine (NORVASC) 5 MG tablet Take 5 mg by mouth daily.    [provider]  atorvastatin (LIPITOR) 10 MG tablet Take 10 mg by mouth daily.    [provider]  benazepril (LOTENSIN) 10 MG tablet Take 10 mg by mouth 2 (two) times daily.    [provider]  fluticasone (FLONASE) 50 MCG/ACT nasal spray as needed. 08/14/14   [provider]  ketoconazole (NIZORAL) 2 % cream Apply 1 application topically daily as needed for irritation.    [provider]  omeprazole (PRILOSEC OTC) 20 MG tablet Take 20 mg by mouth daily.    [provider]  traMADol (ULTRAM) 50 MG tablet Take 1 tablet (50 mg total) by mouth every 6 (six) hours as needed (pain).  09/28/22   Zenia Resides, MD    Physical Exam: Vitals:   05/31/23 1523 05/31/23 1524 05/31/23 1524 05/31/23 1612  BP: (!) 156/97   (!) 170/95  Pulse: 69 70  70  Resp: 19 18  19   Temp:   98 F (36.7 C) 98.2 F (36.8 C)  TempSrc:   Oral Oral  SpO2: 97% 97%  100%  Weight:      Height:       Constitutional: Older adult male currently no acute distress able to follow commands Eyes: PERRL, lids and conjunctivae normal ENMT: Mucous membranes are moist. Normal dentition.  Neck: normal, supple  Respiratory: clear to auscultation bilaterally, no wheezing, no crackles. Normal respiratory effort. No accessory muscle use.  Cardiovascular: Regular rate and rhythm, no murmurs / rubs / gallops. No extremity edema. 2+ pedal pulses.   Abdomen: no tenderness, no masses palpated.   sounds positive.  Musculoskeletal: no clubbing / cyanosis. No joint deformity upper and lower extremities. Good ROM, no contractures. Normal muscle tone.  Skin: no rashes, lesions, ulcers. No induration Neurologic: CN 2-12 grossly intact.  Abnormal sensation noted on the left side of the body.  Strength 5/5 in all 4.  Psychiatric: Normal judgment and insight. Alert and oriented x 3. Normal mood.   Data Reviewed:  EKG reveals a sinus rhythm.  Reviewed labs, imaging, and pertinent records as documented.   Assessment and Plan: Paresthesias of the left side of the body Patient presents with complaints of numbness and tingling of the left side of the body starting this morning.  Also noted to be off balance.  No focal weakness appreciated on physical exam.  Initial CT scan of the brain was negative for any acute abnormality.  Patient was not a candidate for thrombolytics due to mild symptoms.  Risk factors include hypertension hyperlipidemia. -Admit to a telemetry bed -Stroke order set utilized -Neurochecks -Check hemoglobin A1c, lipid panel, magnesium, vitamin B 12 -Check CT angiogram of the head and neck -Check MRI  of the brain -Check echocardiogram -Gentle IV fluids -Aspirin -PT/OT to evaluate and treat  Essential hypertension Blood pressures elevated up to 178/93 on admission. -Allow for permissive hypertension at this time  -Hydralazine IV as needed for systolic blood pressures greater than 220 or diastolic blood pressures greater than 110 -Resume home blood pressure regimen when medically appropriate  Chronic kidney disease stage IIIa Creatinine 1.46 with BUN 15 on admission.  Creatinine 1.61 when last checked on 01/26/2019. -Continue to monitor  Hyperlipidemia Home medication regimen includes atorvastatin 10 mg daily. -Goal LDL less than 70 -Follow-up lipid panel -adjust atorvastatin as needed  Overweight BMI 28.17 kg/m  Sleep apnea -Continue CPAP at night  DVT prophylaxis: Lovenox Advance Care Planning:   Code Status: Full Code   Consults: Neurology  Family Communication: Family updated  including patient's wife and daughters at bedside  Severity of Illness: The appropriate patient status for this patient is OBSERVATION. Observation status is judged to be reasonable and necessary in order to provide the required intensity of service to ensure the patient's safety. The patient's presenting symptoms, physical exam findings, and initial radiographic and laboratory data in the context of their medical condition is felt to place them at decreased risk for further clinical deterioration. Furthermore, it is anticipated that the patient will be medically stable for discharge from the hospital within 2 midnights of admission.   Author: Clydie Braun, MD 05/31/2023 4:53 PM  For on call review [email or web address].

## 2023-05-31 NOTE — ED Provider Notes (Signed)
[place] EMERGENCY DEPARTMENT AT [facility] Provider Note   CSN: [id number] Arrival date & time: 05/31/23  6295     History  Chief Complaint  Patient presents with   Numbness   Tingling    Ryan Duarte is a 62 y.o. male.  Patient is a 62 year old male with a history of hypertension, GERD and hyperlipidemia who presents with numbness to his left side.  He states he woke up at 5 AM and noticed numbness and pins-and-needles to the left side.  The last time he had woke up during the night was about 1 AM and he was not having the symptoms at that time.  He said the symptoms started somewhere between 1 AM and 5 AM this morning.  He notes some numbness to the left side of his face.  He describes pins-and-needles type feeling in his left arm and left leg.  He had initially a muscle cramp in his left lower leg but denies any ongoing pain.  He denies any weakness in the extremities.  He denies any speech deficits.  No vision changes.  He did feel off balance when he was walking this morning.  No prior history of strokes.  He does have some pain in his lower back which he says is chronic but he has had some increased symptoms over the last few days.  No neck pain.       Home Medications Prior to Admission medications   Medication Sig Start Date End Date Taking? Authorizing Provider  amLODipine (NORVASC) 5 MG tablet Take 5 mg by mouth daily.    [provider]  atorvastatin (LIPITOR) 10 MG tablet Take 10 mg by mouth daily.    [provider]  benazepril (LOTENSIN) 10 MG tablet Take 10 mg by mouth 2 (two) times daily.    [provider]  fluticasone (FLONASE) 50 MCG/ACT nasal spray as needed. 08/14/14   [provider]  ketoconazole (NIZORAL) 2 % cream Apply 1 application topically daily as needed for irritation.    [provider]  omeprazole (PRILOSEC OTC) 20 MG tablet Take 20 mg by mouth daily.    [provider]  traMADol  (ULTRAM) 50 MG tablet Take 1 tablet (50 mg total) by mouth every 6 (six) hours as needed (pain). 09/28/22   Zenia Resides, MD      Allergies    Patient has no known allergies.    Review of Systems   Review of Systems  Constitutional:  Negative for chills, diaphoresis, fatigue and fever.  HENT:  Negative for congestion, rhinorrhea and sneezing.   Eyes: Negative.   Respiratory:  Negative for cough, chest tightness and shortness of breath.   Cardiovascular:  Negative for chest pain and leg swelling.  Gastrointestinal:  Negative for abdominal pain, blood in stool, diarrhea, nausea and vomiting.  Genitourinary:  Negative for difficulty urinating, flank pain, frequency and hematuria.  Musculoskeletal:  Positive for back pain. Negative for arthralgias.  Skin:  Negative for rash.  Neurological:  Positive for numbness. Negative for dizziness, speech difficulty, weakness and headaches.    Physical Exam Updated Vital Signs BP (!) 178/93 (BP Location: Right Arm)   Pulse 84   Temp 98.5 F (36.9 C)   Resp 17   Ht 5\' 11"  (1.803 m)   Wt 91.6 kg   SpO2 100%   BMI 28.17 kg/m  Physical Exam Constitutional:      Appearance: He is well-developed.  HENT:  Head: Normocephalic and atraumatic.  Eyes:     Pupils: Pupils are equal, round, and reactive to light.     Comments: Visual fields full to confrontation  Cardiovascular:     Rate and Rhythm: Normal rate and regular rhythm.     Heart sounds: Normal heart sounds.  Pulmonary:     Effort: Pulmonary effort is normal. No respiratory distress.     Breath sounds: Normal breath sounds. No wheezing or rales.  Chest:     Chest wall: No tenderness.  Abdominal:     General: Bowel sounds are normal.     Palpations: Abdomen is soft.     Tenderness: There is no abdominal tenderness. There is no guarding or rebound.  Musculoskeletal:        General: Normal range of motion.     Cervical back: Normal range of motion and neck supple.   Lymphadenopathy:     Cervical: No cervical adenopathy.  Skin:    General: Skin is warm and dry.     Findings: No rash.  Neurological:     Mental Status: He is alert and oriented to person, place, and time.     Comments: Patient has some subjective numbness to light touch in the left side of the face from the eyes down.  Sensation to the forehead is intact to light touch.  No facial drooping.  He has some numbness to light touch in his left arm as compared to his right.  He has normal strength in both upper extremities.  He has normal sensation and motor function to lower extremities.  Finger-nose intact, no pronator drift     ED Results / Procedures / Treatments   Labs (all labs ordered are listed, but only abnormal results are displayed) Labs Reviewed  CBC - Abnormal; Notable for the following components:      Result Value   RDW 15.7 (*)    All other components within normal limits  COMPREHENSIVE METABOLIC PANEL - Abnormal; Notable for the following components:   Glucose, Bld 104 (*)    Creatinine, Ser 1.46 (*)    Total Protein 8.3 (*)    GFR, Estimated 55 (*)    All other components within normal limits  ETHANOL  PROTIME-INR  APTT  DIFFERENTIAL  RAPID URINE DRUG SCREEN, HOSP PERFORMED    EKG EKG Interpretation Date/Time:  Sunday May 31 2023 10:10:02 EST Ventricular Rate:  87 PR Interval:  180 QRS Duration:  80 QT Interval:  352 QTC Calculation: 423 R Axis:   72  Text Interpretation: Normal sinus rhythm Normal ECG When compared with ECG of 26-Jan-2019 09:41, No significant change was found Confirmed by Rolan Bucco [id number]) on 05/31/2023 11:22:49 AM  Radiology CT HEAD WO CONTRAST  Result Date: 05/31/2023 CLINICAL DATA:  Neuro deficit, acute, stroke suspected EXAM: CT HEAD WITHOUT CONTRAST TECHNIQUE: Contiguous axial images were obtained from the base of the skull through the vertex without intravenous contrast. RADIATION DOSE REDUCTION: This exam was performed  according to the departmental dose-optimization program which includes automated exposure control, adjustment of the mA and/or kV according to patient size and/or use of iterative reconstruction technique. COMPARISON:  None Available. FINDINGS: Brain: No hemorrhage. No hydrocephalus. No extra-axial fluid collection. No CT evidence of an acute cortical infarct. No mass effect. No mass lesion. Vascular: No hyperdense vessel or unexpected calcification. Skull: Normal. Negative for fracture or focal lesion. Sinuses/Orbits: No middle ear or mastoid effusion. Mucosal thickening bilateral maxillary sinuses. Orbits are unremarkable. Other: None.  IMPRESSION: No hemorrhage or CT evidence of an acute cortical infarct. Electronically Signed   By: Lorenza Cambridge M.D.   On: 05/31/2023 11:24    Procedures Procedures    Medications Ordered in ED Medications - No data to display  ED Course/ Medical Decision Making/ A&P Clinical Course as of 05/31/23 1322  Sun May 31, 2023  1258 Discussed with Dr. Amada Jupiter.  He favors admission to [facility] for complete stroke workup including MRI.  Will consult the hospitalist. [MB]    Clinical Course User Index [MB] Rolan Bucco, MD                                 Medical Decision Making Amount and/or Complexity of Data Reviewed Labs: ordered. Radiology: ordered.  Risk Decision regarding hospitalization.   Patient is a 62 year old who presents with left-sided facial numbness associated left-sided arm and leg numbness that he woke up with at 5 AM this morning.  Last known normal was 1 AM this morning.  No associated weakness or other deficits although he did say he was off balance.  Code stroke was not activated given that he was out of the 4-1/2-hour window and was LVO negative.  Head CT does not show any acute abnormality.  Labs reviewed and are nonconcerning.  His creatinine is mildly elevated but similar to values based on chart review.  I discussed the case with  Dr. Amada Jupiter with neurology.  He favors admitting the patient to get an MRI and complete stroke workup.  I spoke with Dr. Katrinka Blazing who will admit the patient to [facility] for further treatment.  Plan discussed with the patient and family who is at bedside.  Final Clinical Impression(s) / ED Diagnoses Final diagnoses:  Left sided numbness    Rx / DC Orders ED Discharge Orders     None         Rolan Bucco, MD 05/31/23 1323

## 2023-05-31 NOTE — Plan of Care (Signed)

## 2023-05-31 NOTE — Plan of Care (Signed)
Problem: Education: Goal: Knowledge of General Education information will improve Description: Including pain rating scale, medication(s)/side effects and non-pharmacologic comfort measures 05/31/2023 1734 by Waynette Buttery, RN Outcome: Progressing 05/31/2023 1653 by Waynette Buttery, RN Outcome: Progressing   Problem: Health Behavior/Discharge Planning: Goal: Ability to manage health-related needs will improve 05/31/2023 1734 by Waynette Buttery, RN Outcome: Progressing 05/31/2023 1653 by Waynette Buttery, RN Outcome: Progressing   Problem: Clinical Measurements: Goal: Ability to maintain clinical measurements within normal limits will improve 05/31/2023 1734 by Waynette Buttery, RN Outcome: Progressing 05/31/2023 1653 by Waynette Buttery, RN Outcome: Progressing Goal: Will remain free from infection 05/31/2023 1734 by Waynette Buttery, RN Outcome: Progressing 05/31/2023 1653 by Waynette Buttery, RN Outcome: Progressing Goal: Diagnostic test results will improve 05/31/2023 1734 by Waynette Buttery, RN Outcome: Progressing 05/31/2023 1653 by Waynette Buttery, RN Outcome: Progressing Goal: Respiratory complications will improve 05/31/2023 1734 by Waynette Buttery, RN Outcome: Progressing 05/31/2023 1653 by Waynette Buttery, RN Outcome: Progressing Goal: Cardiovascular complication will be avoided 05/31/2023 1734 by Waynette Buttery, RN Outcome: Progressing 05/31/2023 1653 by Waynette Buttery, RN Outcome: Progressing   Problem: Activity: Goal: Risk for activity intolerance will decrease 05/31/2023 1734 by Waynette Buttery, RN Outcome: Progressing 05/31/2023 1653 by Waynette Buttery, RN Outcome: Progressing   Problem: Nutrition: Goal: Adequate nutrition will be maintained 05/31/2023 1734 by Waynette Buttery, RN Outcome: Progressing 05/31/2023 1653 by Waynette Buttery, RN Outcome: Progressing   Problem: Coping: Goal: Level of anxiety will decrease 05/31/2023 1734 by Waynette Buttery, RN Outcome:  Progressing 05/31/2023 1653 by Waynette Buttery, RN Outcome: Progressing   Problem: Elimination: Goal: Will not experience complications related to bowel motility 05/31/2023 1734 by Waynette Buttery, RN Outcome: Progressing 05/31/2023 1653 by Waynette Buttery, RN Outcome: Progressing Goal: Will not experience complications related to urinary retention 05/31/2023 1734 by Waynette Buttery, RN Outcome: Progressing 05/31/2023 1653 by Waynette Buttery, RN Outcome: Progressing   Problem: Pain Management: Goal: General experience of comfort will improve 05/31/2023 1734 by Waynette Buttery, RN Outcome: Progressing 05/31/2023 1653 by Waynette Buttery, RN Outcome: Progressing   Problem: Safety: Goal: Ability to remain free from injury will improve 05/31/2023 1734 by Waynette Buttery, RN Outcome: Progressing 05/31/2023 1653 by Waynette Buttery, RN Outcome: Progressing   Problem: Skin Integrity: Goal: Risk for impaired skin integrity will decrease 05/31/2023 1734 by Waynette Buttery, RN Outcome: Progressing 05/31/2023 1653 by Waynette Buttery, RN Outcome: Progressing   Problem: Education: Goal: Knowledge of disease or condition will improve Outcome: Progressing Goal: Knowledge of secondary prevention will improve (MUST DOCUMENT ALL) Outcome: Progressing Goal: Knowledge of patient specific risk factors will improve Loraine Leriche N/A or DELETE if not current risk factor) Outcome: Progressing   Problem: Ischemic Stroke/TIA Tissue Perfusion: Goal: Complications of ischemic stroke/TIA will be minimized Outcome: Progressing   Problem: Coping: Goal: Will verbalize positive feelings about self Outcome: Progressing Goal: Will identify appropriate support needs Outcome: Progressing   Problem: Health Behavior/Discharge Planning: Goal: Ability to manage health-related needs will improve Outcome: Progressing Goal: Goals will be collaboratively established with patient/family Outcome: Progressing   Problem:  Self-Care: Goal: Ability to participate in self-care as condition permits will improve Outcome: Progressing Goal: Verbalization of feelings and concerns over difficulty with self-care will improve Outcome: Progressing Goal: Ability to communicate needs accurately will improve Outcome: Progressing   Problem: Nutrition: Goal: Risk of aspiration will decrease Outcome: Progressing  Goal: Dietary intake will improve Outcome: Progressing

## 2023-05-31 NOTE — Consult Note (Signed)
NEUROLOGY CONSULT NOTE   Date of service: May 31, 2023 Patient Name: Ryan Duarte MRN:  [id number] DOB:  11-25-1960 Chief Complaint: "L sided numbness" Requesting Provider: Clydie Braun, MD  History of Present Illness  Ryan Duarte is a 62 y.o. male with past medical history of hypertension, hyperlipidemia, sleep apnea, GERD who presents with sudden onset left-sided numbness.  He reports that he woke up in the middle of the night at 1 AM and felt like he had a cramp in his left leg.  He got up again between 3 to 4 AM and noticed that his left arm and left face felt numb like he had gotten numbing shot from a dentist.  He did not think too much about it went back to sleep and woke up at 8 AM and talk to this about his wife.  He came into the ED where he had CT head without contrast which was negative for any acute intracranial abnormalities.  Case was discussed with neurology with concern for potential TIA/minor ischemic stroke he was admitted to the hospital.  He had MRI brain without contrast which demonstrates a right thalamic acute stroke.  He denies any prior history of strokes, endorses that his father had a stroke, he denies any history of diabetes.  He reports that his blood pressure is well-controlled at home and he checks it at home every other day and it runs in 130s to 140s systolic.  He endorses history of hyperlipidemia.  He reports that he eats wherever he can and is not really conscious about his dietary intake and habits.  He denies any history of atrial fibrillation.  He does not smoke, he does not drink alcohol, he does not use any recreational substances.  LKW: 0100 on 05/31/23. Modified rankin score: 0-Completely asymptomatic and back to baseline post- stroke IV Thrombolysis: Not offered is outside the window and too mild to treat.   EVT: Not offered due to no LVO.  NIHSS components Score: Comment  1a Level of Conscious 0[x]  1[]  2[]  3[]      1b LOC Questions 0[x]   1[]  2[]       1c LOC Commands 0[x]  1[]  2[]       2 Best Gaze 0[x]  1[]  2[]       3 Visual 0[x]  1[]  2[]  3[]      4 Facial Palsy 0[x]  1[]  2[]  3[]      5a Motor Arm - left 0[x]  1[]  2[]  3[]  4[]  UN[]    5b Motor Arm - Right 0[x]  1[]  2[]  3[]  4[]  UN[]    6a Motor Leg - Left 0[x]  1[]  2[]  3[]  4[]  UN[]    6b Motor Leg - Right 0[x]  1[]  2[]  3[]  4[]  UN[]    7 Limb Ataxia 0[x]  1[]  2[]  3[]  UN[]     8 Sensory 0[]  1[x]  2[]  UN[]      9 Best Language 0[x]  1[]  2[]  3[]      10 Dysarthria 0[x]  1[]  2[]  UN[]      11 Extinct. and Inattention 0[x]  1[]  2[]       TOTAL: 1       ROS  Comprehensive ROS performed and pertinent positives documented in HPI   Past History   Past Medical History:  Diagnosis Date   Allergy    SEASONAL   Chronic kidney disease, stage III (moderate) (HCC)    GERD (gastroesophageal reflux disease)    High cholesterol    Hypertension    Sleep apnea    WEAR C PAP    Past Surgical History:  Procedure  Laterality Date   KNEE CARTILAGE SURGERY Right    NASAL SEPTOPLASTY W/ TURBINOPLASTY Bilateral 01/28/2019   Procedure: NASAL SEPTOPLASTY WITH BILATERAL INFERIOR TURBINATE REDUCTION;  Surgeon: Osborn Coho, MD;  Location: [facility];  Service: ENT;  Laterality: Bilateral;    Family History: Family History  Problem Relation Age of Onset   Hypertension Mother    Hypertension Father    Stroke Father    Stroke Maternal Grandfather    Hypertension Maternal Grandfather    Hypertension Paternal Grandfather    Colon cancer Neg Hx    Colon polyps Neg Hx    Crohn's disease Neg Hx    Esophageal cancer Neg Hx    Rectal cancer Neg Hx    Ulcerative colitis Neg Hx    Stomach cancer Neg Hx     Social History  reports that he has never smoked. He has never been exposed to tobacco smoke. He has never used smokeless tobacco. He reports that he does not drink alcohol and does not use drugs.  No Known Allergies  Medications   Current Facility-Administered Medications:    [START ON 06/01/2023]   stroke: early stages of recovery book, , Does not apply, Once, Smith, Rondell A, MD   0.9 %  sodium chloride infusion, , Intravenous, Continuous, Katrinka Blazing, Rondell A, MD, Last Rate: 40 mL/hr at 05/31/23 1743, New Bag at 05/31/23 1743   acetaminophen (TYLENOL) tablet 650 mg, 650 mg, Oral, Q4H PRN **OR** acetaminophen (TYLENOL) 160 MG/5ML solution 650 mg, 650 mg, Per Tube, Q4H PRN **OR** acetaminophen (TYLENOL) suppository 650 mg, 650 mg, Rectal, Q4H PRN, Clydie Braun, MD   [START ON 06/01/2023] aspirin EC tablet 81 mg, 81 mg, Oral, Daily, Smith, Rondell A, MD   atorvastatin (LIPITOR) tablet 10 mg, 10 mg, Oral, Daily, Smith, Rondell A, MD   enoxaparin (LOVENOX) injection 40 mg, 40 mg, Subcutaneous, Q24H, Smith, Rondell A, MD, 40 mg at 05/31/23 1743   hydrALAZINE (APRESOLINE) injection 10 mg, 10 mg, Intravenous, Q4H PRN, Katrinka Blazing, Rondell A, MD   senna-docusate (Senokot-S) tablet 1 tablet, 1 tablet, Oral, QHS PRN, Madelyn Flavors A, MD  Vitals   Vitals:   05/31/23 1524 05/31/23 1612 05/31/23 1955 05/31/23 2029  BP:  (!) 170/95    Pulse:  70 69   Resp:  19 18 (!) 22  Temp: 98 F (36.7 C) 98.2 F (36.8 C)  98.7 F (37.1 C)  TempSrc: Oral Oral  Oral  SpO2:  100% 96%   Weight:      Height:        Body mass index is 28.17 kg/m.  Physical Exam   General: Laying comfortably in bed; in no acute distress.  HENT: Normal oropharynx and mucosa. Normal external appearance of ears and nose.  Neck: Supple, no pain or tenderness  CV: No JVD. No peripheral edema.  Pulmonary: Symmetric Chest rise. Normal respiratory effort.  Abdomen: Soft to touch, non-tender.  Ext: No cyanosis, edema, or deformity  Skin: No rash. Normal palpation of skin.   Musculoskeletal: Normal digits and nails by inspection. No clubbing.   Neurologic Examination  Mental status/Cognition: Alert, oriented to self, place, month and year, good attention.  Speech/language: Fluent, comprehension intact, object naming intact,  repetition intact.  Cranial nerves:   CN II Pupils equal and reactive to light, no VF deficits    CN III,IV,VI EOM intact, no gaze preference or deviation, no nystagmus    CN V Mildly reduced in left lower face.   CN VII  no asymmetry, no nasolabial fold flattening    CN VIII normal hearing to speech    CN IX & X normal palatal elevation, no uvular deviation    CN XI 5/5 head turn and 5/5 shoulder shrug bilaterally    CN XII midline tongue protrusion    Motor:  Muscle bulk: normal, tone normal, pronator drift none tremor none Mvmt Root Nerve  Muscle Right Left Comments  SA C5/6 Ax Deltoid 5 5   EF C5/6 Mc Biceps 5 5   EE C6/7/8 Rad Triceps 5 5   WF C6/7 Med FCR     WE C7/8 PIN ECU     F Ab C8/T1 U ADM/FDI 5 5   HF L1/2/3 Fem Illopsoas 5 4+   KE L2/3/4 Fem Quad 5 5   DF L4/5 D Peron Tib Ant 5 5   PF S1/2 Tibial Grc/Sol 5 5    Sensation:  Light touch Slightly decreased to touch in left lower face, left arm and left leg.   Pin prick    Temperature    Vibration   Proprioception    Coordination/Complex Motor:  - Finger to Nose intact BL - Heel to shin intact BL - Rapid alternating movement are slowed on the left - Gait: deferred for patient safety.   Labs/Imaging/Neurodiagnostic studies   CBC:  Recent Labs  Lab 06/30/23 1147  WBC 6.9  NEUTROABS 5.0  HGB 15.1  HCT 44.4  MCV 84.6  PLT 295    Basic Metabolic Panel:  Lab Results  Component Value Date   NA 136 06-30-2023   K 4.2 06/30/2023   CO2 26 2023-06-30   GLUCOSE 104 (H) Jun 30, 2023   BUN 15 06-30-2023   CREATININE 1.46 (H) 2023-06-30   CALCIUM 9.8 06-30-23   GFRNONAA 55 (L) June 30, 2023   GFRAA 55 (L) 01/26/2019    Lipid Panel:  Lab Results  Component Value Date   LDLCALC 186 (H) June 30, 2023    HgbA1c:  Lab Results  Component Value Date   HGBA1C 5.6 2023/06/30    Urine Drug Screen:     Component Value Date/Time   LABOPIA NONE DETECTED 06-30-23 1147   COCAINSCRNUR NONE DETECTED 06-30-2023  1147   LABBENZ NONE DETECTED Jun 30, 2023 1147   AMPHETMU NONE DETECTED 30-Jun-2023 1147   THCU NONE DETECTED Jun 30, 2023 1147   LABBARB NONE DETECTED 2023-06-30 1147     Alcohol Level     Component Value Date/Time   ETH <10 06-30-2023 1147    INR  Lab Results  Component Value Date   INR 1.1 2023-06-30    APTT  Lab Results  Component Value Date   APTT 30 2023-06-30    AED levels: No results found for: "PHENYTOIN", "ZONISAMIDE", "LAMOTRIGINE", "LEVETIRACETA"    CT Head without contrast(Personally reviewed): CTH was negative for a large hypodensity concerning for a large territory infarct or hyperdensity concerning for an ICH  CT angio Head and Neck with contrast(Personally reviewed): No LVO  MRI Brain(Personally reviewed): R thalamic stroke  Impression   TREJUAN NAQVI is a 62 y.o. 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  The etiology of the stroke is likely small vessel disease.  Recommendations  - Frequent Neuro checks per stroke unit protocol - Recommend obtaining TTE  - Recommend obtaining Lipid panel with LDL - Please start statin if LDL > 70 - Recommend HbA1c to evaluate for diabetes and how well it  is controlled. - Antithrombotic -aspirin 81 mg daily Plavix 75 mg daily for 21 days, followed by aspirin 81 mg daily alone. - Recommend DVT ppx - SBP goal - permissive hypertension first 24 h < 220/110. Held home meds.  - Recommend Telemetry monitoring for arrythmia - Recommend bedside swallow screen prior to PO intake. - Stroke education booklet - Recommend PT/OT/SLP consult   ______________________________________________________________________    Welton Flakes [facility]

## 2023-06-01 ENCOUNTER — Other Ambulatory Visit: Payer: Self-pay

## 2023-06-01 ENCOUNTER — Other Ambulatory Visit (HOSPITAL_COMMUNITY): Payer: Self-pay

## 2023-06-01 ENCOUNTER — Other Ambulatory Visit (HOSPITAL_COMMUNITY): Payer: BC Managed Care – PPO

## 2023-06-01 ENCOUNTER — Observation Stay (HOSPITAL_COMMUNITY): Payer: BC Managed Care – PPO

## 2023-06-01 DIAGNOSIS — I639 Cerebral infarction, unspecified: Secondary | ICD-10-CM

## 2023-06-01 DIAGNOSIS — R202 Paresthesia of skin: Secondary | ICD-10-CM | POA: Diagnosis not present

## 2023-06-01 DIAGNOSIS — I6389 Other cerebral infarction: Secondary | ICD-10-CM | POA: Diagnosis not present

## 2023-06-01 DIAGNOSIS — E7800 Pure hypercholesterolemia, unspecified: Secondary | ICD-10-CM | POA: Diagnosis not present

## 2023-06-01 LAB — ECHOCARDIOGRAM COMPLETE
Area-P 1/2: 2.8 cm2
Calc EF: 68.4 %
Height: 71 in
S' Lateral: 2.5 cm
Single Plane A2C EF: 68.4 %
Single Plane A4C EF: 66.4 %
Weight: 3232 [oz_av]

## 2023-06-01 MED ORDER — CYANOCOBALAMIN 1000 MCG/ML IJ SOLN
1000.0000 ug | Freq: Every day | INTRAMUSCULAR | Status: DC
  Administered 2023-06-01: 1000 ug via SUBCUTANEOUS
  Filled 2023-06-01: qty 1

## 2023-06-01 MED ORDER — CLOPIDOGREL BISULFATE 75 MG PO TABS
75.0000 mg | ORAL_TABLET | Freq: Every day | ORAL | 0 refills | Status: AC
  Filled 2023-06-01: qty 21, 21d supply, fill #0

## 2023-06-01 MED ORDER — VITAMIN B-12 1000 MCG PO TABS
1000.0000 ug | ORAL_TABLET | Freq: Every day | ORAL | 2 refills | Status: AC
  Filled 2023-06-01: qty 30, 30d supply, fill #0

## 2023-06-01 MED ORDER — ATORVASTATIN CALCIUM 80 MG PO TABS
80.0000 mg | ORAL_TABLET | Freq: Every day | ORAL | 0 refills | Status: AC
  Filled 2023-06-01: qty 30, 30d supply, fill #0

## 2023-06-01 MED ORDER — ASPIRIN 81 MG PO TBEC
81.0000 mg | DELAYED_RELEASE_TABLET | Freq: Every day | ORAL | 12 refills | Status: AC
  Filled 2023-06-01: qty 30, 30d supply, fill #0

## 2023-06-01 NOTE — Plan of Care (Signed)
Pt has rested quietly throughout the night with no distress noted. Alert and oriented. On room air. SR/SB on the monitor. Voids per urinal. Neuro status unchanged. Wife at bedside. No complaints voiced.     Problem: Education: Goal: Knowledge of General Education information will improve Description: Including pain rating scale, medication(s)/side effects and non-pharmacologic comfort measures Outcome: Progressing   Problem: Health Behavior/Discharge Planning: Goal: Ability to manage health-related needs will improve Outcome: Progressing   Problem: Clinical Measurements: Goal: Ability to maintain clinical measurements within normal limits will improve Outcome: Progressing Goal: Will remain free from infection Outcome: Progressing Goal: Diagnostic test results will improve Outcome: Progressing Goal: Respiratory complications will improve Outcome: Progressing Goal: Cardiovascular complication will be avoided Outcome: Progressing   Problem: Nutrition: Goal: Adequate nutrition will be maintained Outcome: Progressing   Problem: Coping: Goal: Level of anxiety will decrease Outcome: Progressing   Problem: Pain Management: Goal: General experience of comfort will improve Outcome: Progressing   Problem: Safety: Goal: Ability to remain free from injury will improve Outcome: Progressing   Problem: Education: Goal: Knowledge of disease or condition will improve Outcome: Progressing Goal: Knowledge of secondary prevention will improve (MUST DOCUMENT ALL) Outcome: Progressing Goal: Knowledge of patient specific risk factors will improve Loraine Leriche N/A or DELETE if not current risk factor) Outcome: Progressing   Problem: Ischemic Stroke/TIA Tissue Perfusion: Goal: Complications of ischemic stroke/TIA will be minimized Outcome: Progressing   Problem: Coping: Goal: Will verbalize positive feelings about self Outcome: Progressing Goal: Will identify appropriate support  needs Outcome: Progressing   Problem: Health Behavior/Discharge Planning: Goal: Ability to manage health-related needs will improve Outcome: Progressing Goal: Goals will be collaboratively established with patient/family Outcome: Progressing

## 2023-06-01 NOTE — Progress Notes (Addendum)
STROKE TEAM PROGRESS NOTE   BRIEF HPI Mr. Ryan Duarte is a 62 y.o. male with history of hypertension, hyperlipidemia, sleep apnea, GERD who presents with sudden onset left-sided numbness.   INTERIM HISTORY/SUBJECTIVE He reports that the numbness in his left hand and foot has improved. He has been able to walk independently, does have a little numbness in his fingers and toes. Has a PCP appt in December.. Will follow up with neurology outpatient in 2 months.   OBJECTIVE  CBC    Component Value Date/Time   WBC 6.9 05/31/2023 1147   RBC 5.25 05/31/2023 1147   HGB 15.1 05/31/2023 1147   HCT 44.4 05/31/2023 1147   PLT 295 05/31/2023 1147   MCV 84.6 05/31/2023 1147   MCH 28.8 05/31/2023 1147   MCHC 34.0 05/31/2023 1147   RDW 15.7 (H) 05/31/2023 1147   LYMPHSABS 1.3 05/31/2023 1147   MONOABS 0.6 05/31/2023 1147   EOSABS 0.0 05/31/2023 1147   BASOSABS 0.0 05/31/2023 1147    BMET    Component Value Date/Time   NA 136 05/31/2023 1147   K 4.2 05/31/2023 1147   CL 102 05/31/2023 1147   CO2 26 05/31/2023 1147   GLUCOSE 104 (H) 05/31/2023 1147   BUN 15 05/31/2023 1147   CREATININE 1.46 (H) 05/31/2023 1147   CALCIUM 9.8 05/31/2023 1147   GFRNONAA 55 (L) 05/31/2023 1147    IMAGING past 24 hours CT ANGIO HEAD NECK W WO CM  Result Date: 06/01/2023 CLINICAL DATA:  Stroke suspected EXAM: CT ANGIOGRAPHY HEAD AND NECK WITH AND WITHOUT CONTRAST TECHNIQUE: Multidetector CT imaging of the head and neck was performed using the standard protocol during bolus administration of intravenous contrast. Multiplanar CT image reconstructions and MIPs were obtained to evaluate the vascular anatomy. Carotid stenosis measurements (when applicable) are obtained utilizing NASCET criteria, using the distal internal carotid diameter as the denominator. RADIATION DOSE REDUCTION: This exam was performed according to the departmental dose-optimization program which includes automated exposure control,  adjustment of the mA and/or kV according to patient size and/or use of iterative reconstruction technique. CONTRAST:  75mL OMNIPAQUE IOHEXOL 350 MG/ML SOLN COMPARISON:  No prior CTA available, correlation is made with CT head 05/31/2023 and MRI head 05/31/2023 FINDINGS: CT HEAD FINDINGS For noncontrast findings, please see same day CT head. CTA NECK FINDINGS Aortic arch: Two-vessel arch with a common origin of the brachiocephalic and left common carotid arteries. Imaged portion shows no evidence of aneurysm or dissection. No significant stenosis of the major arch vessel origins. Right carotid system: No evidence of dissection, occlusion, or hemodynamically significant stenosis (greater than 50%). Left carotid system: No evidence of dissection, occlusion, or hemodynamically significant stenosis (greater than 50%). Vertebral arteries: No evidence of dissection, occlusion, or hemodynamically significant stenosis (greater than 50%). Skeleton: No acute osseous abnormality. Degenerative changes in the cervical spine. Other neck: No acute finding. Upper chest: No focal pulmonary opacity or pleural effusion. Review of the MIP images confirms the above findings CTA HEAD FINDINGS Anterior circulation: Both internal carotid arteries are patent to the termini, without significant stenosis. A1 segments patent. Normal anterior communicating artery. Anterior cerebral arteries are patent to their distal aspects without significant stenosis. No M1 stenosis or occlusion. MCA branches perfused to their distal aspects without significant stenosis. Posterior circulation: Vertebral arteries patent to the vertebrobasilar junction without significant stenosis. Posterior inferior cerebellar arteries patent proximally. Basilar patent to its distal aspect without significant stenosis. Superior cerebellar arteries patent proximally. Patent P1 segments. PCAs perfused  to their distal aspects without significant stenosis. The bilateral posterior  communicating arteries are not visualized. Venous sinuses: As permitted by contrast timing, patent. Anatomic variants: None significant. No evidence of aneurysm or vascular malformation. Review of the MIP images confirms the above findings IMPRESSION: 1. No intracranial large vessel occlusion or significant stenosis. 2. No hemodynamically significant stenosis in the neck. Electronically Signed   By: Wiliam Ke M.D.   On: 06/01/2023 01:34   MR BRAIN WO CONTRAST  Result Date: 06/01/2023 CLINICAL DATA:  Neuro deficit, stroke suspected EXAM: MRI HEAD WITHOUT CONTRAST TECHNIQUE: Multiplanar, multiecho pulse sequences of the brain and surrounding structures were obtained without intravenous contrast. COMPARISON:  No prior MRI available, correlation is made with CT head 05/31/2023 FINDINGS: Brain: 8 mm focus of restricted diffusion with ADC correlate right thalamus (series 5, image 85). This is associated with mildly increased T2 hyperintense signal (series 11, image 28). No correlate is seen on the same-day CT. No acute hemorrhage, mass, mass effect, or midline shift. No hydrocephalus or extra-axial collection. Pituitary and craniocervical junction within normal limits. No hemosiderin deposition to suggest remote hemorrhage. Scattered T2 hyperintense signal in the periventricular white matter, likely the sequela of mild chronic small vessel ischemic disease. Vascular: Normal arterial flow voids. Skull and upper cervical spine: Normal marrow signal. Sinuses/Orbits: Mucosal thickening in the ethmoid air cells and maxillary sinuses. No acute finding in the orbits. Other: The mastoid air cells are well aerated. IMPRESSION: 8 mm acute infarct in the right thalamus. These results will be called to the ordering clinician or representative by the Radiologist Assistant, and communication documented in the PACS or [employer]. Electronically Signed   By: Wiliam Ke M.D.   On: 06/01/2023 01:31   CT HEAD WO  CONTRAST  Result Date: 05/31/2023 CLINICAL DATA:  Neuro deficit, acute, stroke suspected EXAM: CT HEAD WITHOUT CONTRAST TECHNIQUE: Contiguous axial images were obtained from the base of the skull through the vertex without intravenous contrast. RADIATION DOSE REDUCTION: This exam was performed according to the departmental dose-optimization program which includes automated exposure control, adjustment of the mA and/or kV according to patient size and/or use of iterative reconstruction technique. COMPARISON:  None Available. FINDINGS: Brain: No hemorrhage. No hydrocephalus. No extra-axial fluid collection. No CT evidence of an acute cortical infarct. No mass effect. No mass lesion. Vascular: No hyperdense vessel or unexpected calcification. Skull: Normal. Negative for fracture or focal lesion. Sinuses/Orbits: No middle ear or mastoid effusion. Mucosal thickening bilateral maxillary sinuses. Orbits are unremarkable. Other: None. IMPRESSION: No hemorrhage or CT evidence of an acute cortical infarct. Electronically Signed   By: Lorenza Cambridge M.D.   On: 05/31/2023 11:24    Vitals:   05/31/23 1955 05/31/23 2029 06/01/23 0002 06/01/23 0420  BP:   139/84 128/79  Pulse: 69  (!) 58   Resp: 18 (!) 22 15 18   Temp:  98.7 F (37.1 C) 98.4 F (36.9 C) 98 F (36.7 C)  TempSrc:  Oral  Oral  SpO2: 96%  95%   Weight:      Height:         PHYSICAL EXAM General:  Alert, well-nourished, well-developed patient in no acute distress Psych:  Mood and affect appropriate for situation CV: Regular rate and rhythm on monitor Respiratory:  Regular, unlabored respirations on room air GI: Abdomen soft and nontender   NEURO:  Mental Status: AA&Ox3, patient is able to give clear and coherent history Speech/Language: speech is without dysarthria or aphasia.  Naming,  repetition, fluency, and comprehension intact.  Cranial Nerves:  II: PERRL. Visual fields full.  III, IV, VI: EOMI. Eyelids elevate symmetrically.  V:  Sensation is intact to light touch and symmetrical to face.  VII: Face is symmetrical resting and smiling VIII: hearing intact to voice. IX, X: Palate elevates symmetrically. Phonation is normal.  YN:WGNFAOZH shrug 5/5. XII: tongue is midline without fasciculations. Motor: 5/5 strength to all muscle groups tested.  Tone: is normal and bulk is normal Sensation- Intact to light touch bilaterally. Extinction absent to light touch to DSS.   Tingling noted in left finger tips and toes Coordination: FTN intact bilaterally, HKS: no ataxia in BLE.No drift.  Gait- deferred  ASSESSMENT/PLAN  Acute Ischemic Infarct:  right thalamic infarct, etiology: small vessel disease  Code Stroke CT head No acute abnormality.  CTA head & neck No LVO or significant stenosis  MRI  8 mm acute infarct in the right thalamus.  2D Echo EF 60-65% LDL 186 HgbA1c 5.6 UDS neg VTE prophylaxis - Lovenox No antithrombotic prior to admission, now on aspirin 81 mg daily and clopidogrel 75 mg daily for 3 weeks and then ASA 81mg  alone. Therapy recommendations:  No follow up needed  Disposition:  Home   Hypertension Home meds:  Norvasc, Lotensin Stable Long term BP goal normotensive  Hyperlipidemia Home meds:  Atorvastatin 10mg  LDL 186, goal < 70 Increase Atorvastatin 80mg   Continue statin at discharge  Other stroke risk factors OSA  Other acute issues AKI versus CKD, creatinine 1.46   Hospital day # 0  Patient seen and examined by NP/APP with MD. MD to update note as needed.   Elmer Picker, DNP, FNP-BC [facility] Pager: [phone]  ATTENDING NOTE: I reviewed above note and agree with the assessment and plan. Pt was seen and examined.   Wife at bedside.  Patient lying in bed, stated that his left-sided numbness has improved, currently still has left fingertips and toes numbness as well as mild left cheek numbness.  Otherwise, neuro intact.  MRI showed right thalamic small infarct,  likely small vessel disease.  LDL 186, increased from home Lipitor 10 to 80.  Continue DAPT for 3 weeks and then aspirin.  PT and OT no recommendation.  For detailed assessment and plan, please refer to above/below as I have made changes wherever appropriate.   Neurology will sign off. Please call with questions. Pt will follow up with stroke clinic NP at [facility] in about 4 weeks. Thanks for the consult.   Marvel Plan, MD PhD Stroke Neurology 06/01/2023 1:46 PM     To contact Stroke Continuity provider, please refer to [email or web address]. After hours, contact General Neurology

## 2023-06-01 NOTE — Evaluation (Signed)
Physical Therapy Evaluation Patient Details Name: Ryan Duarte MRN: [id number] DOB: 11-06-1960 Today's Date: 06/01/2023  History of Present Illness  Pt is a 62 y/o M presenting to ED on 11/10 with L sided numbness/tingling. 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 PMH includes HTN, HLD, CKD IIIA, allergies, GERD, OSA on CPAP   Clinical Impression  PT eval complete. Pt independent bed mobility, transfers, and amb 500' without AD. Ascend/descend 4 steps with L rail mod I. Balance intact. Reports numbness/tingling L midhand/fingers and L forefoot. Pt educated on BEFAST. No further skilled PT intervention indicated. No follow up needs. PT signing off.         If plan is discharge home, recommend the following:     Can travel by private vehicle        Equipment Recommendations None recommended by PT  Recommendations for Other Services       Functional Status Assessment Patient has not had a recent decline in their functional status     Precautions / Restrictions Precautions Precautions: None Restrictions Weight Bearing Restrictions: No      Mobility  Bed Mobility Overal bed mobility: Independent                  Transfers Overall transfer level: Independent Equipment used: None                    Ambulation/Gait Ambulation/Gait assistance: Independent Gait Distance (Feet): 500 Feet Assistive device: None Gait Pattern/deviations: WFL(Within Functional Limits) Gait velocity: WFL Gait velocity interpretation: >2.62 ft/sec, indicative of community ambulatory   General Gait Details: steady gait without AD  Stairs Stairs: Yes Stairs assistance: Modified independent (Device/Increase time) Stair Management: One rail Left, Alternating pattern, Forwards Number of Stairs: 4 General stair comments: number of steps limited by IV pole  Wheelchair Mobility     Tilt Bed    Modified Rankin (Stroke Patients Only) Modified Rankin (Stroke Patients  Only) Pre-Morbid Rankin Score: No symptoms Modified Rankin: No significant disability     Balance Overall balance assessment: Independent                               Standardized Balance Assessment Standardized Balance Assessment : Dynamic Gait Index   Dynamic Gait Index Level Surface: Normal Change in Gait Speed: Normal Gait with Horizontal Head Turns: Normal Gait with Vertical Head Turns: Normal Gait and Pivot Turn: Normal Step Over Obstacle: Normal Step Around Obstacles: Normal Steps: Normal Total Score: 24       Pertinent Vitals/Pain Pain Assessment Pain Assessment: No/denies pain    Home Living Family/patient expects to be discharged to:: Private residence Living Arrangements: Spouse/significant other Available Help at Discharge: Family;Available PRN/intermittently Type of Home: House Home Access: Stairs to enter Entrance Stairs-Rails: Can reach both;Right;Left Entrance Stairs-Number of Steps: 20 (10 into building, then additional 10 to condo)   Home Layout: One level Home Equipment: Cane - single point      Prior Function Prior Level of Function : Independent/Modified Independent             Mobility Comments: cane as needed due to sciatica ADLs Comments: ind, works in transportation for [employer]     Extremity/Trunk Assessment   Upper Extremity Assessment Upper Extremity Assessment: Defer to OT evaluation LUE Deficits / Details: reports some decr sensation in palm and to finger tips, is [facility] for BADL tasks LUE Sensation: decreased light touch  Lower Extremity Assessment Lower Extremity Assessment: LLE deficits/detail LLE Deficits / Details: parasthesia L forefoot, strength intact and symmetrical    Cervical / Trunk Assessment Cervical / Trunk Assessment: Normal  Communication   Communication Communication: No apparent difficulties  Cognition Arousal: Alert Behavior During Therapy: WFL for tasks  assessed/performed Overall Cognitive Status: Within Functional Limits for tasks assessed                                          General Comments General comments (skin integrity, edema, etc.): Pt educated on BEFAST.    Exercises     Assessment/Plan    PT Assessment Patient does not need any further PT services  PT Problem List         PT Treatment Interventions      PT Goals (Current goals can be found in the Care Plan section)  Acute Rehab PT Goals Patient Stated Goal: home, return to work PT Goal Formulation: All assessment and education complete, DC therapy    Frequency       Co-evaluation               AM-PAC PT "6 Clicks" Mobility  Outcome Measure Help needed turning from your back to your side while in a flat bed without using bedrails?: None Help needed moving from lying on your back to sitting on the side of a flat bed without using bedrails?: None Help needed moving to and from a bed to a chair (including a wheelchair)?: None Help needed standing up from a chair using your arms (e.g., wheelchair or bedside chair)?: None Help needed to walk in hospital room?: None Help needed climbing 3-5 steps with a railing? : None 6 Click Score: 24    End of Session Equipment Utilized During Treatment: Gait belt Activity Tolerance: Patient tolerated treatment well Patient left: in bed;with call bell/phone within reach;with family/visitor present Nurse Communication: Mobility status PT Visit Diagnosis: Other symptoms and signs involving the nervous system (R29.898)    Time: 0254-2706 PT Time Calculation (min) (ACUTE ONLY): 16 min   Charges:   PT Evaluation $PT Eval Low Complexity: 1 Low   PT General Charges $$ ACUTE PT VISIT: 1 Visit         Ferd Glassing., PT  Office # [phone]   Ilda Foil 06/01/2023, 10:43 AM

## 2023-06-01 NOTE — Discharge Summary (Signed)
PATIENT DETAILS Name: Ryan Duarte Age: 62 y.o. Sex: male Date of Birth: 1960/07/26 MRN: [id number]. Admitting Physician: Clydie Braun, MD WUJ:[id number], [facility]  Admit Date: 05/31/2023 Discharge date: 06/01/2023  Recommendations for Outpatient Follow-up:  Follow up with PCP in 1-2 weeks Please obtain CMP/CBC in one week Please ensure follow up with stroke clinic  Admitted From:  Home  Disposition: Home   Discharge Condition: good  CODE STATUS:   Code Status: Full Code   Diet recommendation:  Diet Order             Diet - low sodium heart healthy           Diet Heart Room service appropriate? Yes; Fluid consistency: Thin  Diet effective now                    Brief Summary: 62 year old with history of HTN, HLD-who presented with left-sided paresthesias-found to have thalamic stroke.  Significant studies 11/10>> MRI brain: Right thalamic infarct 11/10>> CT angio head/neck: No LVO/significant stenosis in the neck. 11/10>> A1c: 5.6 11/10>> LDL: 186 11/11>> Echo:  Brief Hospital Course: Right thalamic infarct Paresthesias improving Workup as above Aspirin/Plavix x 3 weeks followed by aspirin alone  HLD LDL not at goal Lipitor increased to 80 mg daily  HTN Resume amlodipine/benazepril on discharge  Vitamin B12 deficiency Begin supplementation-PCP to recheck levels in 6 weeks-if still inappropriately low-PCP to consider transitioning to parenteral B12 supplementation.  BMI: Estimated body mass index is 28.17 kg/m as calculated from the following:   Height as of this encounter: 5\' 11"  (1.803 m).   Weight as of this encounter: 91.6 kg.     Discharge Diagnoses:  Principal Problem:   Paresthesias Active Problems:   HTN (hypertension)   Chronic kidney disease, stage III (moderate) (HCC)   High cholesterol   Overweight   Sleep apnea   Discharge Instructions:  Activity:  As tolerated   Discharge  Instructions     Ambulatory referral to Neurology   Complete by: As directed    An appointment is requested in approximately: 8 weeks   Call MD for:  extreme fatigue   Complete by: As directed    Call MD for:  persistant dizziness or light-headedness   Complete by: As directed    Diet - low sodium heart healthy   Complete by: As directed    Discharge instructions   Complete by: As directed    Follow with Primary MD  [facility] [place] in 1-2 weeks  Take/Plavix x 3 weeks-after 3 weeks stop Plavix and continue on aspirin  Your vitamin B12 levels were borderline low-oral supplementation of vitamin B12 has been started-please ask your primary care practitioner to recheck B12 levels in 6 weeks-if still on the lower side-you may need injections.  Please get a complete blood count and chemistry panel checked by your Primary MD at your next visit, and again as instructed by your Primary MD.  Get Medicines reviewed and adjusted: Please take all your medications with you for your next visit with your Primary MD  Laboratory/radiological data: Please request your Primary MD to go over all hospital tests and procedure/radiological results at the follow up, please ask your Primary MD to get all Hospital records sent to his/her office.  In some cases, they will be blood work, cultures and biopsy results pending at the time of your discharge. Please request that your primary care M.D. follows up  on these results.  Also Note the following: If you experience worsening of your admission symptoms, develop shortness of breath, life threatening emergency, suicidal or homicidal thoughts you must seek medical attention immediately by calling 911 or calling your MD immediately  if symptoms less severe.  You must read complete instructions/literature along with all the possible adverse reactions/side effects for all the Medicines you take and that have been prescribed to you. Take any  new Medicines after you have completely understood and accpet all the possible adverse reactions/side effects.   Do not drive when taking Pain medications or sleeping medications (Benzodaizepines)  Do not take more than prescribed Pain, Sleep and Anxiety Medications. It is not advisable to combine anxiety,sleep and pain medications without talking with your primary care practitioner  Special Instructions: If you have smoked or chewed Tobacco  in the last 2 yrs please stop smoking, stop any regular Alcohol  and or any Recreational drug use.  Wear Seat belts while driving.  Please note: You were cared for by a hospitalist during your hospital stay. Once you are discharged, your primary care physician will handle any further medical issues. Please note that NO REFILLS for any discharge medications will be authorized once you are discharged, as it is imperative that you return to your primary care physician (or establish a relationship with a primary care physician if you do not have one) for your post hospital discharge needs so that they can reassess your need for medications and monitor your lab values.   Increase activity slowly   Complete by: As directed       Allergies as of 06/01/2023   No Known Allergies      Medication List     TAKE these medications    amLODipine 5 MG tablet Commonly known as: NORVASC Take 5 mg by mouth daily.   aspirin EC 81 MG tablet Take 1 tablet (81 mg total) by mouth daily. Swallow whole.   atorvastatin 80 MG tablet Commonly known as: LIPITOR Take 1 tablet (80 mg total) by mouth daily. What changed:  medication strength how much to take   benazepril 10 MG tablet Commonly known as: LOTENSIN Take 10 mg by mouth daily.   clopidogrel 75 MG tablet Commonly known as: PLAVIX Take 1 tablet (75 mg total) by mouth daily for 21 days.   cyanocobalamin 1000 MCG tablet Commonly known as: VITAMIN B12 Take 1 tablet (1,000 mcg total) by mouth daily.         Follow-up Information     [facility], [facility] [place]. Schedule an appointment as soon as possible for a visit in 1 week(s).   Specialty: Family Medicine Contact information: [address] [place] [address] [phone]         [facility] Follow up.   Specialty: Neurology Why: Office will call with date/time, If you dont hear from them,please give them a call Contact information: [address] [place] [address] [phone]               No Known Allergies   Other Procedures/Studies: CT ANGIO HEAD NECK W WO CM  Result Date: 06/01/2023 CLINICAL DATA:  Stroke suspected EXAM: CT ANGIOGRAPHY HEAD AND NECK WITH AND WITHOUT CONTRAST TECHNIQUE: Multidetector CT imaging of the head and neck was performed using the standard protocol during bolus administration of intravenous contrast. Multiplanar CT image reconstructions and MIPs were obtained to evaluate the vascular anatomy. Carotid stenosis measurements (  when applicable) are obtained utilizing NASCET criteria, using the distal internal carotid diameter as the denominator. RADIATION DOSE REDUCTION: This exam was performed according to the departmental dose-optimization program which includes automated exposure control, adjustment of the mA and/or kV according to patient size and/or use of iterative reconstruction technique. CONTRAST:  75mL OMNIPAQUE IOHEXOL 350 MG/ML SOLN COMPARISON:  No prior CTA available, correlation is made with CT head 05/31/2023 and MRI head 05/31/2023 FINDINGS: CT HEAD FINDINGS For noncontrast findings, please see same day CT head. CTA NECK FINDINGS Aortic arch: Two-vessel arch with a common origin of the brachiocephalic and left common carotid arteries. Imaged portion shows no evidence of aneurysm or dissection. No significant stenosis of the major arch vessel origins. Right carotid system: No evidence of dissection,  occlusion, or hemodynamically significant stenosis (greater than 50%). Left carotid system: No evidence of dissection, occlusion, or hemodynamically significant stenosis (greater than 50%). Vertebral arteries: No evidence of dissection, occlusion, or hemodynamically significant stenosis (greater than 50%). Skeleton: No acute osseous abnormality. Degenerative changes in the cervical spine. Other neck: No acute finding. Upper chest: No focal pulmonary opacity or pleural effusion. Review of the MIP images confirms the above findings CTA HEAD FINDINGS Anterior circulation: Both internal carotid arteries are patent to the termini, without significant stenosis. A1 segments patent. Normal anterior communicating artery. Anterior cerebral arteries are patent to their distal aspects without significant stenosis. No M1 stenosis or occlusion. MCA branches perfused to their distal aspects without significant stenosis. Posterior circulation: Vertebral arteries patent to the vertebrobasilar junction without significant stenosis. Posterior inferior cerebellar arteries patent proximally. Basilar patent to its distal aspect without significant stenosis. Superior cerebellar arteries patent proximally. Patent P1 segments. PCAs perfused to their distal aspects without significant stenosis. The bilateral posterior communicating arteries are not visualized. Venous sinuses: As permitted by contrast timing, patent. Anatomic variants: None significant. No evidence of aneurysm or vascular malformation. Review of the MIP images confirms the above findings IMPRESSION: 1. No intracranial large vessel occlusion or significant stenosis. 2. No hemodynamically significant stenosis in the neck. Electronically Signed   By: Wiliam Ke M.D.   On: 06/01/2023 01:34   MR BRAIN WO CONTRAST  Result Date: 06/01/2023 CLINICAL DATA:  Neuro deficit, stroke suspected EXAM: MRI HEAD WITHOUT CONTRAST TECHNIQUE: Multiplanar, multiecho pulse sequences of the  brain and surrounding structures were obtained without intravenous contrast. COMPARISON:  No prior MRI available, correlation is made with CT head 05/31/2023 FINDINGS: Brain: 8 mm focus of restricted diffusion with ADC correlate right thalamus (series 5, image 85). This is associated with mildly increased T2 hyperintense signal (series 11, image 28). No correlate is seen on the same-day CT. No acute hemorrhage, mass, mass effect, or midline shift. No hydrocephalus or extra-axial collection. Pituitary and craniocervical junction within normal limits. No hemosiderin deposition to suggest remote hemorrhage. Scattered T2 hyperintense signal in the periventricular white matter, likely the sequela of mild chronic small vessel ischemic disease. Vascular: Normal arterial flow voids. Skull and upper cervical spine: Normal marrow signal. Sinuses/Orbits: Mucosal thickening in the ethmoid air cells and maxillary sinuses. No acute finding in the orbits. Other: The mastoid air cells are well aerated. IMPRESSION: 8 mm acute infarct in the right thalamus. These results will be called to the ordering clinician or representative by the Radiologist Assistant, and communication documented in the PACS or [employer]. Electronically Signed   By: Wiliam Ke M.D.   On: 06/01/2023 01:31   CT HEAD WO CONTRAST  Result Date: 05/31/2023 CLINICAL  DATA:  Neuro deficit, acute, stroke suspected EXAM: CT HEAD WITHOUT CONTRAST TECHNIQUE: Contiguous axial images were obtained from the base of the skull through the vertex without intravenous contrast. RADIATION DOSE REDUCTION: This exam was performed according to the departmental dose-optimization program which includes automated exposure control, adjustment of the mA and/or kV according to patient size and/or use of iterative reconstruction technique. COMPARISON:  None Available. FINDINGS: Brain: No hemorrhage. No hydrocephalus. No extra-axial fluid collection. No CT evidence of an acute  cortical infarct. No mass effect. No mass lesion. Vascular: No hyperdense vessel or unexpected calcification. Skull: Normal. Negative for fracture or focal lesion. Sinuses/Orbits: No middle ear or mastoid effusion. Mucosal thickening bilateral maxillary sinuses. Orbits are unremarkable. Other: None. IMPRESSION: No hemorrhage or CT evidence of an acute cortical infarct. Electronically Signed   By: Lorenza Cambridge M.D.   On: 05/31/2023 11:24     TODAY-DAY OF DISCHARGE:  Subjective:   Ryan Duarte today has no headache,no chest abdominal pain,no new weakness tingling or numbness, feels much better wants to go home today.   Objective:   Blood pressure (!) 153/88, pulse 68, temperature 98.3 F (36.8 C), temperature source Oral, resp. rate 17, height 5\' 11"  (1.803 m), weight 91.6 kg, SpO2 95%.  Intake/Output Summary (Last 24 hours) at 06/01/2023 1128 Last data filed at 05/31/2023 1800 Gross per 24 hour  Intake 240 ml  Output --  Net 240 ml   Filed Weights   05/31/23 1006  Weight: 91.6 kg    Exam: Awake Alert, Oriented *3, No new F.N deficits, Normal affect [place].AT,PERRAL Supple Neck,No JVD, No cervical lymphadenopathy appriciated.  Symmetrical Chest wall movement, Good air movement bilaterally, CTAB RRR,No Gallops,Rubs or new Murmurs, No Parasternal Heave +ve B.Sounds, Abd Soft, Non tender, No organomegaly appriciated, No rebound -guarding or rigidity. No Cyanosis, Clubbing or edema, No new Rash or bruise   PERTINENT RADIOLOGIC STUDIES: CT ANGIO HEAD NECK W WO CM  Result Date: 06/01/2023 CLINICAL DATA:  Stroke suspected EXAM: CT ANGIOGRAPHY HEAD AND NECK WITH AND WITHOUT CONTRAST TECHNIQUE: Multidetector CT imaging of the head and neck was performed using the standard protocol during bolus administration of intravenous contrast. Multiplanar CT image reconstructions and MIPs were obtained to evaluate the vascular anatomy. Carotid stenosis measurements (when applicable) are obtained  utilizing NASCET criteria, using the distal internal carotid diameter as the denominator. RADIATION DOSE REDUCTION: This exam was performed according to the departmental dose-optimization program which includes automated exposure control, adjustment of the mA and/or kV according to patient size and/or use of iterative reconstruction technique. CONTRAST:  75mL OMNIPAQUE IOHEXOL 350 MG/ML SOLN COMPARISON:  No prior CTA available, correlation is made with CT head 05/31/2023 and MRI head 05/31/2023 FINDINGS: CT HEAD FINDINGS For noncontrast findings, please see same day CT head. CTA NECK FINDINGS Aortic arch: Two-vessel arch with a common origin of the brachiocephalic and left common carotid arteries. Imaged portion shows no evidence of aneurysm or dissection. No significant stenosis of the major arch vessel origins. Right carotid system: No evidence of dissection, occlusion, or hemodynamically significant stenosis (greater than 50%). Left carotid system: No evidence of dissection, occlusion, or hemodynamically significant stenosis (greater than 50%). Vertebral arteries: No evidence of dissection, occlusion, or hemodynamically significant stenosis (greater than 50%). Skeleton: No acute osseous abnormality. Degenerative changes in the cervical spine. Other neck: No acute finding. Upper chest: No focal pulmonary opacity or pleural effusion. Review of the MIP images confirms the above findings CTA HEAD FINDINGS Anterior circulation: Both internal  carotid arteries are patent to the termini, without significant stenosis. A1 segments patent. Normal anterior communicating artery. Anterior cerebral arteries are patent to their distal aspects without significant stenosis. No M1 stenosis or occlusion. MCA branches perfused to their distal aspects without significant stenosis. Posterior circulation: Vertebral arteries patent to the vertebrobasilar junction without significant stenosis. Posterior inferior cerebellar arteries patent  proximally. Basilar patent to its distal aspect without significant stenosis. Superior cerebellar arteries patent proximally. Patent P1 segments. PCAs perfused to their distal aspects without significant stenosis. The bilateral posterior communicating arteries are not visualized. Venous sinuses: As permitted by contrast timing, patent. Anatomic variants: None significant. No evidence of aneurysm or vascular malformation. Review of the MIP images confirms the above findings IMPRESSION: 1. No intracranial large vessel occlusion or significant stenosis. 2. No hemodynamically significant stenosis in the neck. Electronically Signed   By: Wiliam Ke M.D.   On: 06/01/2023 01:34   MR BRAIN WO CONTRAST  Result Date: 06/01/2023 CLINICAL DATA:  Neuro deficit, stroke suspected EXAM: MRI HEAD WITHOUT CONTRAST TECHNIQUE: Multiplanar, multiecho pulse sequences of the brain and surrounding structures were obtained without intravenous contrast. COMPARISON:  No prior MRI available, correlation is made with CT head 05/31/2023 FINDINGS: Brain: 8 mm focus of restricted diffusion with ADC correlate right thalamus (series 5, image 85). This is associated with mildly increased T2 hyperintense signal (series 11, image 28). No correlate is seen on the same-day CT. No acute hemorrhage, mass, mass effect, or midline shift. No hydrocephalus or extra-axial collection. Pituitary and craniocervical junction within normal limits. No hemosiderin deposition to suggest remote hemorrhage. Scattered T2 hyperintense signal in the periventricular white matter, likely the sequela of mild chronic small vessel ischemic disease. Vascular: Normal arterial flow voids. Skull and upper cervical spine: Normal marrow signal. Sinuses/Orbits: Mucosal thickening in the ethmoid air cells and maxillary sinuses. No acute finding in the orbits. Other: The mastoid air cells are well aerated. IMPRESSION: 8 mm acute infarct in the right thalamus. These results will  be called to the ordering clinician or representative by the Radiologist Assistant, and communication documented in the PACS or [employer]. Electronically Signed   By: Wiliam Ke M.D.   On: 06/01/2023 01:31   CT HEAD WO CONTRAST  Result Date: 05/31/2023 CLINICAL DATA:  Neuro deficit, acute, stroke suspected EXAM: CT HEAD WITHOUT CONTRAST TECHNIQUE: Contiguous axial images were obtained from the base of the skull through the vertex without intravenous contrast. RADIATION DOSE REDUCTION: This exam was performed according to the departmental dose-optimization program which includes automated exposure control, adjustment of the mA and/or kV according to patient size and/or use of iterative reconstruction technique. COMPARISON:  None Available. FINDINGS: Brain: No hemorrhage. No hydrocephalus. No extra-axial fluid collection. No CT evidence of an acute cortical infarct. No mass effect. No mass lesion. Vascular: No hyperdense vessel or unexpected calcification. Skull: Normal. Negative for fracture or focal lesion. Sinuses/Orbits: No middle ear or mastoid effusion. Mucosal thickening bilateral maxillary sinuses. Orbits are unremarkable. Other: None. IMPRESSION: No hemorrhage or CT evidence of an acute cortical infarct. Electronically Signed   By: Lorenza Cambridge M.D.   On: 05/31/2023 11:24     PERTINENT LAB RESULTS: CBC: Recent Labs    05/31/23 1147  WBC 6.9  HGB 15.1  HCT 44.4  PLT 295   CMET CMP     Component Value Date/Time   NA 136 05/31/2023 1147   K 4.2 05/31/2023 1147   CL 102 05/31/2023 1147   CO2 26 05/31/2023  1147   GLUCOSE 104 (H) 05/31/2023 1147   BUN 15 05/31/2023 1147   CREATININE 1.46 (H) 05/31/2023 1147   CALCIUM 9.8 05/31/2023 1147   PROT 8.3 (H) 05/31/2023 1147   ALBUMIN 4.4 05/31/2023 1147   AST 16 05/31/2023 1147   ALT 17 05/31/2023 1147   ALKPHOS 61 05/31/2023 1147   BILITOT 0.5 05/31/2023 1147   GFRNONAA 55 (L) 05/31/2023 1147    GFR Estimated Creatinine  Clearance: 62.3 mL/min (A) (by C-G formula based on SCr of 1.46 mg/dL (H)). No results for input(s): "LIPASE", "AMYLASE" in the last 72 hours. No results for input(s): "CKTOTAL", "CKMB", "CKMBINDEX", "TROPONINI" in the last 72 hours. Invalid input(s): "POCBNP" No results for input(s): "DDIMER" in the last 72 hours. Recent Labs    05/31/23 1720  HGBA1C 5.6   Recent Labs    05/31/23 1720  CHOL 252*  HDL 46  LDLCALC 186*  TRIG 99  CHOLHDL 5.5   Recent Labs    05/31/23 1720  TSH 0.885   Recent Labs    05/31/23 1720  VITAMINB12 220   Coags: Recent Labs    05/31/23 1147  INR 1.1   Microbiology: No results found for this or any previous visit (from the past 240 hour(s)).  FURTHER DISCHARGE INSTRUCTIONS:  Get Medicines reviewed and adjusted: Please take all your medications with you for your next visit with your Primary MD  Laboratory/radiological data: Please request your Primary MD to go over all hospital tests and procedure/radiological results at the follow up, please ask your Primary MD to get all Hospital records sent to his/her office.  In some cases, they will be blood work, cultures and biopsy results pending at the time of your discharge. Please request that your primary care M.D. goes through all the records of your hospital data and follows up on these results.  Also Note the following: If you experience worsening of your admission symptoms, develop shortness of breath, life threatening emergency, suicidal or homicidal thoughts you must seek medical attention immediately by calling 911 or calling your MD immediately  if symptoms less severe.  You must read complete instructions/literature along with all the possible adverse reactions/side effects for all the Medicines you take and that have been prescribed to you. Take any new Medicines after you have completely understood and accpet all the possible adverse reactions/side effects.   Do not drive when taking  Pain medications or sleeping medications (Benzodaizepines)  Do not take more than prescribed Pain, Sleep and Anxiety Medications. It is not advisable to combine anxiety,sleep and pain medications without talking with your primary care practitioner  Special Instructions: If you have smoked or chewed Tobacco  in the last 2 yrs please stop smoking, stop any regular Alcohol  and or any Recreational drug use.  Wear Seat belts while driving.  Please note: You were cared for by a hospitalist during your hospital stay. Once you are discharged, your primary care physician will handle any further medical issues. Please note that NO REFILLS for any discharge medications will be authorized once you are discharged, as it is imperative that you return to your primary care physician (or establish a relationship with a primary care physician if you do not have one) for your post hospital discharge needs so that they can reassess your need for medications and monitor your lab values.  Total Time spent coordinating discharge including counseling, education and face to face time equals greater than 30 minutes.  Signed: Jeoffrey Massed 06/01/2023  11:28 AM

## 2023-06-01 NOTE — Evaluation (Signed)
Occupational Therapy Evaluation Patient Details Name: Ryan Duarte MRN: [id number] DOB: [birth year] Today's Date: 06/01/2023   History of Present Illness Pt is a 62 y/o M presenting to ED on 11/10 with L sided numbness/tingling. 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 PMH includes HTN, HLD, CKD IIIA, allergies, GERD, OSA on CPAP   Clinical Impression   Pt reports ind at baseline with ADLs works in transportation for the school system.  Reports using cane intermittently when having low back pain. Pt currently needing set up - supervision for ADLs, ind with bed mobility and mod I for transfers without AD. Pt reports some residual numbness in L hand and fingers but functional for BADL tasks. Pt presenting with impairments listed below, will follow acutely. Recommend OP OT at d/c.       If plan is discharge home, recommend the following: Assist for transportation;Assistance with cooking/housework    Functional Status Assessment  Patient has had a recent decline in their functional status and demonstrates the ability to make significant improvements in function in a reasonable and predictable amount of time.  Equipment Recommendations  None recommended by OT    Recommendations for Other Services       Precautions / Restrictions Restrictions Weight Bearing Restrictions: No      Mobility Bed Mobility Overal bed mobility: Independent                  Transfers Overall transfer level: Modified independent                        Balance Overall balance assessment: No apparent balance deficits (not formally assessed)                                         ADL either performed or assessed with clinical judgement   ADL Overall ADL's : Needs assistance/impaired Eating/Feeding: Set up   Grooming: Set up   Upper Body Bathing: Supervision/ safety   Lower Body Bathing: Supervison/ safety   Upper Body Dressing : Supervision/safety   Lower Body  Dressing: Supervision/safety   Toilet Transfer: Modified Independent   Toileting- Clothing Manipulation and Hygiene: Supervision/safety       Functional mobility during ADLs: Supervision/safety       Vision   Vision Assessment?: No apparent visual deficits     Perception Perception: Not tested       Praxis Praxis: Not tested       Pertinent Vitals/Pain Pain Assessment Pain Assessment: No/denies pain     Extremity/Trunk Assessment Upper Extremity Assessment Upper Extremity Assessment: Right hand dominant;LUE deficits/detail LUE Deficits / Details: reports some decr sensation in palm and to finger tips, is [facility] for BADL tasks LUE Sensation: decreased light touch   Lower Extremity Assessment Lower Extremity Assessment: Defer to PT evaluation   Cervical / Trunk Assessment Cervical / Trunk Assessment: Normal   Communication     Cognition Arousal: Alert Behavior During Therapy: WFL for tasks assessed/performed Overall Cognitive Status: Within Functional Limits for tasks assessed                                       General Comments  VSS    Exercises     Shoulder Instructions      Home Living Family/patient expects  to be discharged to:: Private residence Living Arrangements: Spouse/significant other Available Help at Discharge: Family;Available PRN/intermittently Type of Home: House Home Access: Stairs to enter [employer] of Steps: 20 (10 into building, then additional 10 to condo) Entrance Stairs-Rails: Can reach both Home Layout: One level     Bathroom Shower/Tub: Walk-in shower         Home Equipment: Gilmer Mor - single point          Prior Functioning/Environment Prior Level of Function : Needs assist             Mobility Comments: used cane as needed 2/2 back pain ADLs Comments: ind, works in transportation for [employer]        OT Problem List: Impaired sensation      OT  Treatment/Interventions: Environmental manager;Therapeutic exercise;Energy conservation;DME and/or AE instruction;Therapeutic activities;Visual/perceptual remediation/compensation;Patient/family education;Balance training    OT Goals(Current goals can be found in the care plan section) Acute Rehab OT Goals Patient Stated Goal: none stated OT Goal Formulation: With patient Time For Goal Achievement: 06/15/23 Potential to Achieve Goals: Good  OT Frequency: Min 1X/week    Co-evaluation              AM-PAC OT "6 Clicks" Daily Activity     Outcome Measure Help from another person eating meals?: None Help from another person taking care of personal grooming?: None Help from another person toileting, which includes using toliet, bedpan, or urinal?: None Help from another person bathing (including washing, rinsing, drying)?: None Help from another person to put on and taking off regular upper body clothing?: None Help from another person to put on and taking off regular lower body clothing?: None 6 Click Score: 24   End of Session Nurse Communication: Mobility status  Activity Tolerance: Patient tolerated treatment well Patient left: in bed;with call bell/phone within reach;with family/visitor present  OT Visit Diagnosis: Muscle weakness (generalized) (M62.81)                Time: 5621-3086 OT Time Calculation (min): 22 min Charges:  OT General Charges $OT Visit: 1 Visit OT Evaluation $OT Eval Low Complexity: 1 Low  Ryan Duarte K, OTD, OTR/L SecureChat Preferred Acute Rehab [phone]   Ryan Duarte 06/01/2023, 8:41 AM

## 2023-06-01 NOTE — Progress Notes (Signed)
  Echocardiogram 2D Echocardiogram has been performed.  Janalyn Harder 06/01/2023, 10:09 AM

## 2023-06-01 NOTE — Plan of Care (Signed)
  Problem: Education: Goal: Knowledge of General Education information will improve Description: Including pain rating scale, medication(s)/side effects and non-pharmacologic comfort measures Outcome: Completed/Met   Problem: Health Behavior/Discharge Planning: Goal: Ability to manage health-related needs will improve Outcome: Completed/Met   Problem: Clinical Measurements: Goal: Ability to maintain clinical measurements within normal limits will improve Outcome: Completed/Met Goal: Will remain free from infection Outcome: Completed/Met Goal: Diagnostic test results will improve Outcome: Completed/Met Goal: Respiratory complications will improve Outcome: Completed/Met Goal: Cardiovascular complication will be avoided Outcome: Completed/Met   Problem: Nutrition: Goal: Adequate nutrition will be maintained Outcome: Completed/Met   Problem: Coping: Goal: Level of anxiety will decrease Outcome: Completed/Met   Problem: Pain Management: Goal: General experience of comfort will improve Outcome: Completed/Met   Problem: Safety: Goal: Ability to remain free from injury will improve Outcome: Completed/Met   Problem: Education: Goal: Knowledge of disease or condition will improve Outcome: Completed/Met Goal: Knowledge of secondary prevention will improve (MUST DOCUMENT ALL) Outcome: Completed/Met Goal: Knowledge of patient specific risk factors will improve Loraine Leriche N/A or DELETE if not current risk factor) Outcome: Completed/Met   Problem: Ischemic Stroke/TIA Tissue Perfusion: Goal: Complications of ischemic stroke/TIA will be minimized Outcome: Completed/Met   Problem: Coping: Goal: Will verbalize positive feelings about self Outcome: Completed/Met Goal: Will identify appropriate support needs Outcome: Completed/Met   Problem: Health Behavior/Discharge Planning: Goal: Ability to manage health-related needs will improve Outcome: Completed/Met Goal: Goals will be  collaboratively established with patient/family Outcome: Completed/Met

## 2023-06-29 NOTE — Progress Notes (Unsigned)
Patient: Ryan Duarte Date of Birth: Mar 05, 1961  Reason for Visit: Follow up History from: Patient, wife  Primary Neurologist: Pearlean Brownie  ASSESSMENT AND PLAN 62 y.o. 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  Presented with left-sided numbness.  Vascular risk factors: HTN, HLD.  Mild lingering tingling to his left corner of mouth, left fingertips.  -He has done great.  Has minimal deficits.  Has returned to full function. -Continue aspirin 81 mg daily for secondary stroke prevention -Strict management of vascular risk factors with a goal BP less than 130/90, A1c less than 7.0, LDL less than 70 for secondary stroke prevention -Keep close follow-up with primary care for stroke prevention management, return here as needed  HISTORY OF PRESENT ILLNESS: Today 06/30/23 Here with his wife. Still has slightly numbness to left corner mouth, tingling in left finger tips. Remains on aspirin 81 mg daily. He does have CPAP, but doesn't use, had deviated septum repaired. Recent sleep study per PCP, was told no sleep apnea. Works as Retail banker for [employer], they are short drivers. The DOT cleared him to return to driving. Eating healthy, exercises, scared him into being healthy. Deficits do not affect eating. He doesn't need any therapy. Is back to 96% normal. Has seen primary care. LDL 06/17/23 was 109. Has lost 20 lbs. feels great, no issues or concerns.  HISTORY  Admitted from the ER 05/31/2023 after sudden onset left-sided numbness, while asleep thought he had a muscle cramp in his left calf, when he woke up the left side was tingling. He was able to walk, but dragging the left side.  Found to have right thalamic infarct likely due to small vessel disease.  -Code stroke CT head no acute abnormality -CTA head and neck no LVO or significant stenosis -MRI of the brain 8 mm acute infarct in the right thalamus -2D echo EF 60 to 65% -LDL 186, Lipitor was increased to  80 -A1c 5.6 -UDS negative -No antithrombotic prior to admission, 3 weeks DAPT aspirin 81 and Plavix 75 then aspirin 81 mg daily alone  REVIEW OF SYSTEMS: Out of a complete 14 system review of symptoms, the patient complains only of the following symptoms, and all other reviewed systems are negative.  See HPI  ALLERGIES: No Known Allergies  HOME MEDICATIONS: Outpatient Medications Prior to Visit  Medication Sig Dispense Refill   amLODipine (NORVASC) 10 MG tablet Take 10 mg by mouth daily.     aspirin EC 81 MG tablet Take 1 tablet (81 mg total) by mouth daily. Swallow whole. 30 tablet 12   atorvastatin (LIPITOR) 80 MG tablet Take 1 tablet (80 mg total) by mouth daily. 30 tablet 0   benazepril (LOTENSIN) 10 MG tablet Take 10 mg by mouth daily.     cyanocobalamin (VITAMIN B12) 1000 MCG tablet Take 1 tablet (1,000 mcg total) by mouth daily. 30 tablet 2   amLODipine (NORVASC) 5 MG tablet Take 5 mg by mouth daily. (Patient not taking: Reported on 06/30/2023)     No facility-administered medications prior to visit.    PAST MEDICAL HISTORY: Past Medical History:  Diagnosis Date   Allergy    SEASONAL   Chronic kidney disease, stage III (moderate) (HCC)    GERD (gastroesophageal reflux disease)    High cholesterol    Hypertension    Sleep apnea    WEAR C PAP    PAST SURGICAL HISTORY: Past Surgical History:  Procedure Laterality Date  KNEE CARTILAGE SURGERY Right    NASAL SEPTOPLASTY W/ TURBINOPLASTY Bilateral 01/28/2019   Procedure: NASAL SEPTOPLASTY WITH BILATERAL INFERIOR TURBINATE REDUCTION;  Surgeon: Osborn Coho, MD;  Location: [facility] OR;  Service: ENT;  Laterality: Bilateral;    FAMILY HISTORY: Family History  Problem Relation Age of Onset   Hypertension Mother    Hypertension Father    Stroke Father    Stroke Maternal Grandfather    Hypertension Maternal Grandfather    Hypertension Paternal Grandfather    Colon cancer Neg Hx    Colon polyps Neg Hx    Crohn's  disease Neg Hx    Esophageal cancer Neg Hx    Rectal cancer Neg Hx    Ulcerative colitis Neg Hx    Stomach cancer Neg Hx     SOCIAL HISTORY: Social History   Socioeconomic History   Marital status: Married    Spouse name: Not on file   Number of children: Not on file   Years of education: Not on file   Highest education level: Not on file  Occupational History   Not on file  Tobacco Use   Smoking status: Never    Passive exposure: Never   Smokeless tobacco: Never  Vaping Use   Vaping status: Never Used  Substance and Sexual Activity   Alcohol use: No   Drug use: No   Sexual activity: Not on file  Other Topics Concern   Not on file  Social History Narrative   Right handed   Caffeine-none   Married and has 6 daughters   Works as Academic librarian Strain: Not on file  Food Insecurity: No Food Insecurity (05/31/2023)   Hunger Vital Sign    Worried About Running Out of Food in the Last Year: Never true    Ran Out of Food in the Last Year: Never true  Transportation Needs: No Transportation Needs (05/31/2023)   PRAPARE - Administrator, Civil Service (Medical): No    Lack of Transportation (Non-Medical): No  Physical Activity: Not on file  Stress: Not on file  Social Connections: Not on file  Intimate Partner Violence: Not At Risk (05/31/2023)   Humiliation, Afraid, Rape, and Kick questionnaire    Fear of Current or Ex-Partner: No    Emotionally Abused: No    Physically Abused: No    Sexually Abused: No    PHYSICAL EXAM  Vitals:   06/30/23 1010  BP: 136/70  Weight: 200 lb (90.7 kg)  Height: 5\' 11"  (1.803 m)   Body mass index is 27.89 kg/m.  Generalized: Well developed, in no acute distress  Neurological examination  Mentation: Alert oriented to time, place, history taking. Follows all commands speech and language fluent Cranial nerve II-XII: Pupils were equal round reactive to light.  Extraocular movements were full, visual field were full on confrontational test. Facial sensation and strength were normal. Head turning and shoulder shrug  were normal and symmetric. Motor: The motor testing reveals 5 over 5 strength of all 4 extremities. Good symmetric motor tone is noted throughout.  Sensory: Sensory testing is intact to soft touch on all 4 extremities. No evidence of extinction is noted.  Still has mild tingling to his left fingertips, corner of left mouth. Coordination: Cerebellar testing reveals good finger-nose-finger and heel-to-shin bilaterally.  Gait and station: Gait is normal. Tandem gait is normal.  Reflexes: Deep tendon reflexes are symmetric and normal bilaterally.   DIAGNOSTIC DATA (LABS,  IMAGING, TESTING) - I reviewed patient records, labs, notes, testing and imaging myself where available.  Lab Results  Component Value Date   WBC 6.9 05/31/2023   HGB 15.1 05/31/2023   HCT 44.4 05/31/2023   MCV 84.6 05/31/2023   PLT 295 05/31/2023      Component Value Date/Time   NA 136 05/31/2023 1147   K 4.2 05/31/2023 1147   CL 102 05/31/2023 1147   CO2 26 05/31/2023 1147   GLUCOSE 104 (H) 05/31/2023 1147   BUN 15 05/31/2023 1147   CREATININE 1.46 (H) 05/31/2023 1147   CALCIUM 9.8 05/31/2023 1147   PROT 8.3 (H) 05/31/2023 1147   ALBUMIN 4.4 05/31/2023 1147   AST 16 05/31/2023 1147   ALT 17 05/31/2023 1147   ALKPHOS 61 05/31/2023 1147   BILITOT 0.5 05/31/2023 1147   GFRNONAA 55 (L) 05/31/2023 1147   GFRAA 55 (L) 01/26/2019 0935   Lab Results  Component Value Date   CHOL 252 (H) 05/31/2023   HDL 46 05/31/2023   LDLCALC 186 (H) 05/31/2023   TRIG 99 05/31/2023   CHOLHDL 5.5 05/31/2023   Lab Results  Component Value Date   HGBA1C 5.6 05/31/2023   Lab Results  Component Value Date   VITAMINB12 220 05/31/2023   Lab Results  Component Value Date   TSH 0.885 05/31/2023    Margie Ege, AGNP-C, DNP 06/30/2023, 10:13 AM [facility] [address], [address] [place], [place] [address] [phone]

## 2023-06-30 ENCOUNTER — Encounter: Payer: Self-pay | Admitting: Neurology

## 2023-06-30 ENCOUNTER — Ambulatory Visit: Payer: BC Managed Care – PPO | Admitting: Neurology

## 2023-06-30 VITALS — BP 136/70 | Ht 71.0 in | Wt 200.0 lb

## 2023-06-30 DIAGNOSIS — I639 Cerebral infarction, unspecified: Secondary | ICD-10-CM | POA: Insufficient documentation

## 2023-06-30 DIAGNOSIS — I159 Secondary hypertension, unspecified: Secondary | ICD-10-CM

## 2023-06-30 DIAGNOSIS — E7800 Pure hypercholesterolemia, unspecified: Secondary | ICD-10-CM | POA: Diagnosis not present

## 2023-06-30 NOTE — Patient Instructions (Addendum)
Continue aspirin 81 mg daily for secondary stroke prevention  Strict management of vascular risk factors with a goal BP less than 130/90, A1c less than 7.0, LDL less than 70 for secondary stroke prevention For any acute symptoms go to the ER

## 2023-07-04 NOTE — Progress Notes (Signed)
I agree with the above plan 

## 2023-08-11 ENCOUNTER — Telehealth: Payer: Self-pay | Admitting: Neurology

## 2023-08-11 DIAGNOSIS — I639 Cerebral infarction, unspecified: Secondary | ICD-10-CM

## 2023-08-11 NOTE — Telephone Encounter (Signed)
Pt stated that he uses copper glove for vibration and heat and seems to help his symptoms. Wants to know if ok until assessed by pt

## 2023-08-11 NOTE — Telephone Encounter (Signed)
Call to patient who reports residual tingling in left fingers and jaw. He has massaging gloves and a tens unit but has not used yet. He has been exercising and eating better, but is wanting to know what else he can do for the residual tingling. Advised I would send to Maralyn Sago for recommendations. Patient appreciative and is fine with a mychart reply.

## 2023-08-11 NOTE — Telephone Encounter (Signed)
Pt asking if there is any kind therapy for the tingling in fingers. Would like a call back

## 2023-08-11 NOTE — Telephone Encounter (Signed)
Called pt and relayed information. Pt agreeable to pt and would like to come here for it if possible

## 2023-08-12 ENCOUNTER — Telehealth: Payer: Self-pay | Admitting: Neurology

## 2023-08-12 NOTE — Telephone Encounter (Signed)
Referral for physical therapy sent through [facility] to [facility]. Phone: [phone].

## 2023-08-12 NOTE — Telephone Encounter (Signed)
Mychart message sent.

## 2023-08-12 NOTE — Telephone Encounter (Signed)
Fine to use his glove. Order for PT placed. Thanks  Orders Placed This Encounter  Procedures   Ambulatory referral to Physical Therapy

## 2023-08-13 ENCOUNTER — Telehealth: Payer: Self-pay | Admitting: Neurology

## 2023-08-13 NOTE — Addendum Note (Signed)
Addended by: Glean Salvo on: 08/13/2023 04:15 PM   Modules accepted: Orders

## 2023-08-13 NOTE — Telephone Encounter (Signed)
Referral switched to OT for post CVA paresthesia. Referrals coordinator reached out from [facility].

## 2023-08-13 NOTE — Telephone Encounter (Signed)
Referral for occupational therapy sent through [facility] to [facility] :[facility]  [facility] Phone: [phone]

## 2023-09-07 ENCOUNTER — Other Ambulatory Visit: Payer: Self-pay

## 2023-09-07 ENCOUNTER — Ambulatory Visit: Payer: 59 | Attending: Neurology | Admitting: Occupational Therapy

## 2023-09-07 DIAGNOSIS — I639 Cerebral infarction, unspecified: Secondary | ICD-10-CM | POA: Insufficient documentation

## 2023-09-07 DIAGNOSIS — R29818 Other symptoms and signs involving the nervous system: Secondary | ICD-10-CM | POA: Diagnosis present

## 2023-09-07 DIAGNOSIS — M6281 Muscle weakness (generalized): Secondary | ICD-10-CM | POA: Diagnosis present

## 2023-09-07 DIAGNOSIS — R278 Other lack of coordination: Secondary | ICD-10-CM

## 2023-09-07 DIAGNOSIS — R208 Other disturbances of skin sensation: Secondary | ICD-10-CM | POA: Diagnosis present

## 2023-09-07 NOTE — Patient Instructions (Signed)
 Access Code: [email or web address] URL: [email or web address] Date: 09/07/2023 Prepared by: Amada Kingfisher  Exercises - Putty Squeezes  - 1-2 x daily - 10 reps - Rolling Putty on Table  - 1-2 x daily - 10 reps - Finger Pinch and Pull with Putty  - 1-2 x daily - 10 reps - 3-Point Pinch with Putty  - 1-2 x daily - 10 reps - Tip PUSH with Putty  - 1-2 x daily - 10 reps - Key Pinch with Putty  - 1-2 x daily - 10 reps - Finger Extension with Putty  - 1-2 x daily - 10 reps - Finger Adduction with Putty  - 1-2 x daily - 10 reps - Removing Marbles from Putty  - 1-2 x daily - 10 reps

## 2023-09-07 NOTE — Therapy (Signed)
 OUTPATIENT OCCUPATIONAL THERAPY NEURO EVALUATION & TREATMENT  Patient Name: Ryan Duarte. MRN: [id number] DOB:11-Aug-1960, 63 y.o., male 20 Date: 09/07/2023  PCP: Darrow Bussing, MD REFERRING PROVIDER: Glean Salvo, NP  END OF SESSION:  OT End of Session - 09/07/23 0752     Visit Number 1    Number of Visits 4   including eval/tx   Date for OT Re-Evaluation 10/09/23    Authorization Type [employer] 2025 $72 Copay [id number] No Auth Required    OT Start Time 0755    OT Stop Time 0901    OT Time Calculation (min) 66 min    Equipment Utilized During Treatment Testing Material, green putty    Activity Tolerance Patient tolerated treatment well    Behavior During Therapy WFL for tasks assessed/performed             Past Medical History:  Diagnosis Date   Allergy    SEASONAL   Chronic kidney disease, stage III (moderate) (HCC)    GERD (gastroesophageal reflux disease)    High cholesterol    Hypertension    Sleep apnea    WEAR C PAP   Past Surgical History:  Procedure Laterality Date   KNEE CARTILAGE SURGERY Right    NASAL SEPTOPLASTY W/ TURBINOPLASTY Bilateral 01/28/2019   Procedure: NASAL SEPTOPLASTY WITH BILATERAL INFERIOR TURBINATE REDUCTION;  Surgeon: Osborn Coho, MD;  Location: [facility] OR;  Service: ENT;  Laterality: Bilateral;   Patient Active Problem List   Diagnosis Date Noted   CVA (cerebral vascular accident) (HCC) 06/30/2023   Paresthesias 05/31/2023   Chronic kidney disease, stage III (moderate) (HCC) 05/31/2023   Overweight 05/31/2023   High cholesterol    Sleep apnea    Deviated septum 01/28/2019   Allergy 10/22/2016   Left knee pain 10/22/2016   HTN (hypertension) 06/04/2011    ONSET DATE: 08/13/2023  REFERRING DIAG: I63.9 (ICD-10-CM) - Cerebrovascular accident (CVA), unspecified mechanism  THERAPY DIAG:  Other disturbances of skin sensation  Muscle weakness (generalized)  Other lack of coordination  Other symptoms and signs  involving the nervous system  Rationale for Evaluation and Treatment: Rehabilitation  SUBJECTIVE:   SUBJECTIVE STATEMENT: Pt reports he is here for OT today due to the side effects of his stroke ie) lingering tingling sensation in L fingers tips and side of face.  He reports no lingering difficulty with strength or coordination as he only had issues about 1 hour after his stroke.   Pt did report recent injections for L3-4 s/p issues r/t [employer].  Pt accompanied by: self  PERTINENT HISTORY: 63 y.o. 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  Presented with left-sided numbness. Mild lingering tingling to his left corner of mouth, left fingertips.  PMH includes HTN, HLD, CKD IIIA, allergies, GERD, OSA on CPAP  PRECAUTIONS: Back - recent history of injections for back pain  WEIGHT BEARING RESTRICTIONS: No  PAIN:  Are you having pain? None in UE  FALLS: Has patient fallen in last 6 months? No  LIVING ENVIRONMENT:  Living Arrangements: Spouse (has 6 daughters 77 -82 yo and 5 grandchildren) Type of Home: Condo Home Access: Stairs to enter Secretary/administrator of Steps: 20 (10 into building, then additional 10 to [employer]) Entrance Stairs-Rails: Can reach both Home Layout: One level Bathroom Shower/Tub: Walk-in shower Home Equipment: Gilmer Mor - single point - reportedly intermittent use when having low back pain.  Employment: Pt works in transportation for the school system.  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  Pt reports it feels like a novocain injection and although it is gradually going away it is still around corner of his mouth and fingertips x 3 fingers and thumb  OBJECTIVE:  Note: Objective measures were completed at Evaluation unless otherwise noted.  HAND DOMINANCE: Right  ADLs: Overall ADLs:  Independent  IADLs: Independent   MOBILITY STATUS: Independent  POSTURE COMMENTS:  forward head Sitting balance:  WNL  ACTIVITY TOLERANCE: Activity tolerance: WNL  UPPER EXTREMITY ROM:  WNL  UPPER EXTREMITY MMT:   5/5  HAND FUNCTION: Grip strength:  Right: 87, 92, 83 lbs Avg: 87.3 lbs Left: 73, 67, 59  lbs, Avg: 66.3 lbs (note gradual decline in strength)  Lateral pinch:  Right: 23, 24 lbs, Avg 23.5 lbs Left: 20, 22  lbs, Avg 21 lbs  3 point pinch:  Right: 21, 19 lbs, Avg 20 lbs Left: 12, 15 lbs, Avg 13.5 lbs  Tip pinch:  Right 14, 14 lbs, Avg 14 lbs Left: 12, 13 lbs Avg 12.5 lbs  COORDINATION: 9 Hole Peg test: Right: 22.76 sec; Left: 22.82 sec Box and Blocks:  Right 59 blocks, Left 61 blocks  SENSATION: Tingling and numbness on L side of mouth and index, middle and ring finger plus thumb of L hand  Two-point discriminator R index, middle, ring 4-5, thumb 4 L digits more consistent at 5  EDEMA: NA  MUSCLE TONE: WNL  COGNITION: Overall cognitive status: Within functional limits for tasks assessed  VISION: Subjective report: Wears glasses  Baseline vision: Bifocals Visual history: NA  VISION ASSESSMENT: Not tested  Patient has difficulty with following activities due to following visual impairments: NA  PERCEPTION: Not tested  PRAXIS: Not tested  OBSERVATIONS: Pt ambulates with no AE and no loss of balance.  Despite pt stating he wears bifocals, pt did not have glasses on during eval. The pt appears well kept and no observable differences between hands at rest.                                                                                                                            TREATMENT DATE: 09/07/23   Therapeutic Activities: Initiated Putty Exercises with green putty to begin sensory stimulation, strengthening, and coordination of L UE.  Patient provided visual demonstration, verbal and tactile cues as needed to improve performance of the  various exercises/activities including:   - Putty Squeezes - cues to squeeze putty into log for use with other exercises and to fold putty in half with 1 hand  - Putty Rolls - encourage to roll putty into logs with sensory stimulation to entire length of hand, fingers and wrist as needed   - Pinch and Pull with Putty - this motion is combined with different pinches (3-Point Pinch, Tip Pinch, Key Pinch) - patient encouraged to combine tripod, pincer  and/or key pinch with "pinch and pull" motion of putty pulling away from midline, changing between different pinches and changing different directions to change grip  -Finger Extension with Putty - pt shown how to work on task with all fingers and thumb as well as individual fingers in opposition to thumb  -Finger Adduction with Putty - pt shown how to weave putty between his digits and squeeze them together  - Removing Objects from Putty  - encouraged to hide items (coins, marble, dice etc) and use one hand at a time to find the objects and identify them by tactile input before s/he digs them out and can see them visually.  OT educated patient on theraputty recommendations: avoid hot environments, place in designated container, avoid contact with fabrics. Patient verbalized understanding.    Patient benefited from extra time, verbal/tactile cues, and modeling of task to allow time for processing of verbal instructions and improve motor planning of unfamiliar movements.  Self Care:  Initiated education with patient in Sensory re-education in an attempt to retrain and stimulate sensory pathways. Techniques recommended included: touching different textured objects, massage, vibration, pressure, determining joint position, identifying different temperatures. Patient verbalized understanding. Handout not provided at time of eval.    PATIENT EDUCATION: Education details: OT role, POC considerations, Putty activities and Sensory Post-stroke sensory  deficits and re-education Person educated: Patient Education method: Explanation, Demonstration, Verbal cues, and Handouts Education comprehension: verbalized understanding, returned demonstration, verbal cues required, tactile cues required, and needs further education  HOME EXERCISE PROGRAM: 09/07/23: Putty Activities Access Code: [email or web address]   GOALS: Goals reviewed with patient? Yes  LONG TERM GOALS: Target date: 10/09/23  Patient will demonstrate updated B UE HEP for consistent L grip strength and improved sensory awareness with visual handouts only for proper execution. Baseline: New to outpt OT  Goal status: IN Progress - putty HEP provided at eval  2.  Pt will independently recall the 5 main sensory precautions (cold, heat, sharp, chemical, and heavy) as needed to prevent injury/harm secondary to impairments.   Baseline: New to outpt OT  Goal status: INITIAL  3.  Pt will independently recall the 5 sensory precautions resensitization strategies to address sensory impairments at eval.  Baseline: Tingling and numbness on L side of mouth and index, middle and ring finger plus thumb of L hand Goal status: IN Progress - Discussed resensitization strategies at eval  4.  Pt will complete stereognosis challenges with >90% item discrimination Baseline: TBA Goal status: INITIAL  5. Patient will demonstrate at least 70 lbs LUE grip strength x 3 trials as needed to open jars and other containers.  Baseline: Left: 73, 67, 59  lbs, Avg: 66.3 lbs (note gradual decline in strength)  Goal Status: IN Progress - putty HEP initiated at eval   ASSESSMENT:  CLINICAL IMPRESSION: Patient is a 63 y.o. male who was seen today for occupational therapy evaluation for sensory deficits s/p CVA. Hx includes HTN, HLD, CKD IIIA, allergies, GERD, OSA on CPAP. Patient currently presents with residual sensory deficits and impairments and would benefit from short duration of skilled OT services in the outpatient  setting to work on impairments to help pt regain further tactile awareness and/or learn appropriate coping/HEP ideas for management at home.    PERFORMANCE DEFICITS: in functional skills including sensation, strength, and Fine motor control, and psychosocial skills including coping strategies.   IMPAIRMENTS: are limiting patient from  full sensory awareness of LUE .   CO-MORBIDITIES: may have co-morbidities  that  affects occupational performance. Patient will benefit from skilled OT to address above impairments and improve overall function.  MODIFICATION OR ASSISTANCE TO COMPLETE EVALUATION: No modification of tasks or assist necessary to complete an evaluation.  OT OCCUPATIONAL PROFILE AND HISTORY: Problem focused assessment: Including review of records relating to presenting problem.  CLINICAL DECISION MAKING: LOW - limited treatment options, no task modification necessary  REHAB POTENTIAL: Excellent  EVALUATION COMPLEXITY: Low    PLAN:  OT FREQUENCY: 1x/week  OT DURATION: 4 weeks  PLANNED INTERVENTIONS: 97535 self care/ADL training, 40981 therapeutic exercise, 97530 therapeutic activity, 97112 neuromuscular re-education, 97039 fluidotherapy, coping strategies training, and patient/family education  RECOMMENDED OTHER SERVICES: NA  CONSULTED AND AGREED WITH PLAN OF CARE: Patient  PLAN FOR NEXT SESSION:  Check Putty Activities Sensory stimulation education/handouts Rice bin and try fluidotherapy with objects Stereognosis challenges   Victorino Sparrow, OT 09/07/2023, 9:10 AM

## 2023-09-15 ENCOUNTER — Encounter: Payer: Self-pay | Admitting: Occupational Therapy

## 2023-09-22 ENCOUNTER — Ambulatory Visit: Payer: 59 | Admitting: Occupational Therapy

## 2023-09-22 ENCOUNTER — Encounter: Payer: Self-pay | Admitting: Occupational Therapy

## 2023-09-22 DIAGNOSIS — R29818 Other symptoms and signs involving the nervous system: Secondary | ICD-10-CM

## 2023-09-22 DIAGNOSIS — R208 Other disturbances of skin sensation: Secondary | ICD-10-CM

## 2023-09-22 DIAGNOSIS — R278 Other lack of coordination: Secondary | ICD-10-CM

## 2023-09-22 DIAGNOSIS — M6281 Muscle weakness (generalized): Secondary | ICD-10-CM

## 2023-09-22 NOTE — Therapy (Signed)
 [facility] [address] [place], [place], [address] Phone: [phone]   Fax:  [phone]  Patient Details  Name: Ryan Duarte. MRN: [id number] Date of Birth: 1960-07-25  Phone call:  09/22/23 - Pt contacted clinic and pt reported appointments were not authorized and does not need appointments d/t feeling better.  OCCUPATIONAL THERAPY DISCHARGE SUMMARY  Visits from Start of Care: 1  Current functional level related to goals / functional outcomes: Unable to assess goals d/t pt not returning since 09/07/23 initial OT eval.   Remaining deficits: Unable to assess d/t pt not returning since last visit. Per pt's phone call to clinic, pt reported feeling better (see phone call above).  Education / Equipment: Pt has some needed materials and education. See tx notes for more details.    Patient goals were unable to be assessed. Patient is being discharged due to the patient's request.     Wynetta Emery, OT 09/22/2023, 11:09 AM  [facility] [address] [place], [place], [address] Phone: [phone]   Fax:  [phone]

## 2023-09-29 ENCOUNTER — Encounter: Payer: Self-pay | Admitting: Occupational Therapy
